# Patient Record
Sex: Female | Born: 1965
Health system: Southern US, Community
[De-identification: ages and names within clinical notes are randomized; demographics above are authoritative.]

## PROBLEM LIST (undated history)

## (undated) ENCOUNTER — Emergency Department (HOSPITAL_COMMUNITY): Admission: EM | Payer: 59 | Source: Home / Self Care

## (undated) HISTORY — PX: REDUCTION MAMMAPLASTY: SUR839

## (undated) HISTORY — PX: NO PAST SURGERIES: SHX2092

---

## 1998-05-11 ENCOUNTER — Inpatient Hospital Stay (HOSPITAL_COMMUNITY): Admission: AD | Admit: 1998-05-11 | Discharge: 1998-05-11 | Payer: Self-pay | Admitting: *Deleted

## 1998-07-08 ENCOUNTER — Emergency Department (HOSPITAL_COMMUNITY): Admission: EM | Admit: 1998-07-08 | Discharge: 1998-07-08 | Payer: Self-pay | Admitting: Emergency Medicine

## 1998-07-08 ENCOUNTER — Encounter: Payer: Self-pay | Admitting: Emergency Medicine

## 1998-08-10 ENCOUNTER — Inpatient Hospital Stay (HOSPITAL_COMMUNITY): Admission: AD | Admit: 1998-08-10 | Discharge: 1998-08-10 | Payer: Self-pay | Admitting: *Deleted

## 1998-10-26 ENCOUNTER — Ambulatory Visit (HOSPITAL_COMMUNITY): Admission: RE | Admit: 1998-10-26 | Discharge: 1998-10-26 | Payer: Self-pay | Admitting: *Deleted

## 2000-11-13 ENCOUNTER — Encounter: Payer: Self-pay | Admitting: *Deleted

## 2000-11-13 ENCOUNTER — Encounter: Admission: RE | Admit: 2000-11-13 | Discharge: 2000-11-13 | Payer: Self-pay | Admitting: *Deleted

## 2000-12-23 ENCOUNTER — Encounter: Admission: RE | Admit: 2000-12-23 | Discharge: 2000-12-23 | Payer: Self-pay | Admitting: Family Medicine

## 2000-12-24 ENCOUNTER — Encounter: Admission: RE | Admit: 2000-12-24 | Discharge: 2000-12-24 | Payer: Self-pay | Admitting: Family Medicine

## 2001-01-27 ENCOUNTER — Encounter: Admission: RE | Admit: 2001-01-27 | Discharge: 2001-01-27 | Payer: Self-pay | Admitting: Sports Medicine

## 2002-01-26 ENCOUNTER — Encounter: Admission: RE | Admit: 2002-01-26 | Discharge: 2002-01-26 | Payer: Self-pay | Admitting: Family Medicine

## 2002-01-28 ENCOUNTER — Encounter (INDEPENDENT_AMBULATORY_CARE_PROVIDER_SITE_OTHER): Payer: Self-pay | Admitting: *Deleted

## 2002-01-28 LAB — CONVERTED CEMR LAB

## 2002-02-19 ENCOUNTER — Encounter: Admission: RE | Admit: 2002-02-19 | Discharge: 2002-02-19 | Payer: Self-pay | Admitting: Family Medicine

## 2002-05-18 ENCOUNTER — Encounter: Admission: RE | Admit: 2002-05-18 | Discharge: 2002-08-16 | Payer: Self-pay | Admitting: Family Medicine

## 2002-12-04 ENCOUNTER — Encounter: Payer: Self-pay | Admitting: Emergency Medicine

## 2002-12-04 ENCOUNTER — Emergency Department (HOSPITAL_COMMUNITY): Admission: EM | Admit: 2002-12-04 | Discharge: 2002-12-04 | Payer: Self-pay | Admitting: Emergency Medicine

## 2003-12-27 ENCOUNTER — Encounter: Admission: RE | Admit: 2003-12-27 | Discharge: 2003-12-27 | Payer: Self-pay | Admitting: Sports Medicine

## 2005-01-03 ENCOUNTER — Ambulatory Visit: Payer: Self-pay | Admitting: Sports Medicine

## 2006-11-28 ENCOUNTER — Encounter (INDEPENDENT_AMBULATORY_CARE_PROVIDER_SITE_OTHER): Payer: Self-pay | Admitting: *Deleted

## 2008-01-15 ENCOUNTER — Encounter (INDEPENDENT_AMBULATORY_CARE_PROVIDER_SITE_OTHER): Payer: Self-pay | Admitting: Family Medicine

## 2008-01-15 ENCOUNTER — Ambulatory Visit: Payer: Self-pay | Admitting: Family Medicine

## 2008-01-18 ENCOUNTER — Ambulatory Visit: Payer: Self-pay | Admitting: Sports Medicine

## 2008-01-18 ENCOUNTER — Encounter (INDEPENDENT_AMBULATORY_CARE_PROVIDER_SITE_OTHER): Payer: Self-pay | Admitting: Family Medicine

## 2008-12-21 ENCOUNTER — Encounter: Payer: Self-pay | Admitting: Family Medicine

## 2008-12-21 ENCOUNTER — Ambulatory Visit: Payer: Self-pay | Admitting: Family Medicine

## 2008-12-21 LAB — CONVERTED CEMR LAB
AST: 17 units/L (ref 0–37)
Albumin: 4.1 g/dL (ref 3.5–5.2)
Calcium: 9.4 mg/dL (ref 8.4–10.5)
Chloride: 105 meq/L (ref 96–112)
Creatinine, Ser: 0.8 mg/dL (ref 0.40–1.20)
Direct LDL: 111 mg/dL — ABNORMAL HIGH
Potassium: 3.9 meq/L (ref 3.5–5.3)
Sodium: 138 meq/L (ref 135–145)

## 2008-12-22 ENCOUNTER — Encounter: Payer: Self-pay | Admitting: Family Medicine

## 2008-12-28 ENCOUNTER — Ambulatory Visit (HOSPITAL_COMMUNITY): Admission: RE | Admit: 2008-12-28 | Discharge: 2008-12-28 | Payer: Self-pay | Admitting: Family Medicine

## 2009-01-05 ENCOUNTER — Telehealth: Payer: Self-pay | Admitting: Family Medicine

## 2009-01-06 ENCOUNTER — Telehealth: Payer: Self-pay | Admitting: Family Medicine

## 2010-01-18 ENCOUNTER — Ambulatory Visit (HOSPITAL_COMMUNITY): Admission: RE | Admit: 2010-01-18 | Discharge: 2010-01-18 | Payer: Self-pay | Admitting: Internal Medicine

## 2010-01-26 ENCOUNTER — Encounter: Admission: RE | Admit: 2010-01-26 | Discharge: 2010-01-26 | Payer: Self-pay | Admitting: Internal Medicine

## 2010-01-26 ENCOUNTER — Encounter: Payer: Self-pay | Admitting: Family Medicine

## 2010-02-01 ENCOUNTER — Telehealth: Payer: Self-pay | Admitting: Family Medicine

## 2010-04-25 ENCOUNTER — Encounter: Payer: Self-pay | Admitting: Family Medicine

## 2010-04-25 ENCOUNTER — Telehealth: Payer: Self-pay | Admitting: Family Medicine

## 2010-04-25 ENCOUNTER — Ambulatory Visit: Payer: Self-pay | Admitting: Family Medicine

## 2010-04-25 LAB — CONVERTED CEMR LAB
ALT: 18 units/L (ref 0–35)
Albumin: 4 g/dL (ref 3.5–5.2)
Alkaline Phosphatase: 64 units/L (ref 39–117)
Glucose, Bld: 88 mg/dL (ref 70–99)
MCHC: 32.3 g/dL (ref 30.0–36.0)
MCV: 90.9 fL (ref 78.0–100.0)
Platelets: 267 10*3/uL (ref 150–400)
RDW: 14 % (ref 11.5–15.5)
Total Bilirubin: 0.3 mg/dL (ref 0.3–1.2)
Total Protein: 7.1 g/dL (ref 6.0–8.3)
Triglycerides: 109 mg/dL (ref <150)
WBC: 5.8 10*3/uL (ref 4.0–10.5)

## 2010-04-26 ENCOUNTER — Encounter: Payer: Self-pay | Admitting: Family Medicine

## 2010-08-16 ENCOUNTER — Encounter: Admission: RE | Admit: 2010-08-16 | Discharge: 2010-08-16 | Payer: Self-pay | Admitting: Internal Medicine

## 2010-10-21 ENCOUNTER — Encounter: Payer: Self-pay | Admitting: Internal Medicine

## 2010-11-01 NOTE — Letter (Signed)
Summary: Left Breast Mass  [facility]  [address]   [place], [place] [address]   Phone: [phone]  Fax: [phone]    01/26/2010  [facility] [address] [place], [place]  [address]  Dear Ms. Hilda Blades,   The recent Mammogram test of your breasts showed that the breast tissue is almost entirely fatty.  A possible mass is noted in the left breast.  Spot compression views and possibly sonography are recommended for further evaluation.  The right breast is unremarkable (normal).  The center has contacted you or will contact you for further studies to evaluate the left breast.  If you have not been contacted by them, please call the imaging center for an appointment.    Please let me know if you have any questions or concerns.          Sincerely,   Dyann Goodspeed MD  Appended Document: Left Breast Mass mailed certified.

## 2010-11-01 NOTE — Letter (Signed)
Summary: Lipid Letter  [facility]  [address]   [place], [place] [address]   Phone: [phone]  Fax: [phone]    04/26/2010  Debra Jones [address] [place], [place]  [address]  Dear Debra Jones:  We have carefully reviewed your last lipid profile from 04/25/2010 and the results are noted below with a summary of recommendations for lipid management.    Cholesterol:       169     Goal: < 200   HDL "good" Cholesterol:   44     Goal: > 39   LDL "bad" Cholesterol:   103     Goal: < 130   Triglycerides:       109     Goal: < 150    TLC Diet (Therapeutic Lifestyle Change): Saturated Fats & Transfatty acids should be kept < 7% of total calories ***Reduce Saturated Fats Polyunstaurated Fat can be up to 10% of total calories Monounsaturated Fat Fat can be up to 20% of total calories Total Fat should be no greater than 25-35% of total calories Carbohydrates should be 50-60% of total calories Protein should be approximately 15% of total calories Fiber should be at least 20-30 grams a day ***Increased fiber may help lower LDL Total Cholesterol should be < 200mg /day Consider adding plant stanol/sterols to diet (example: Benacol spread) ***A higher intake of unsaturated fat may reduce Triglycerides and Increase HDL    Adjunctive Measures (may lower LIPIDS and reduce risk of Heart Attack) include: Aerobic Exercise (20-30 minutes 3-4 times a week) Limit Alcohol Consumption Weight Reduction Aspirin 81 mg a day by mouth (if not allergic or contraindicated) Dietary Fiber 20-30 grams a day by mouth  Your other lab results were:  * Electrolytes: Normal * Kidneys: Normal * Liver: Normal * Hemoglobin: Not Anemic  If you have any questions, please call. We appreciate being able to work with you.   Sincerely,    [facility] Neave Lenger MD  Appended Document: Lipid Letter mailed

## 2010-11-01 NOTE — Progress Notes (Signed)
Summary: Need contact info for patient   Phone Note Outgoing Call   Call placed by: Amariyana Heacox MD,  Feb 01, 2010 10:49 AM Call placed to: Patient Summary of Call: Tried to call patient at home.  Phone number disconnected.  Tried calling at work number, [employer], pt does not work there anymore.  Left message for Supervisor to call me so that I can obtain contact information.  Initial call taken by: Nasiir Monts MD,  Feb 01, 2010 10:49 AM

## 2010-11-01 NOTE — Progress Notes (Signed)
Summary: phn msg: dentist contact info   Phone Note Call from Patient Call back at [phone]   Caller: Patient Summary of Call: Was to give Dr. Janalyn Harder her dentist number and it is Dr. Elder Cyphers [phone]. Initial call taken by: Clydell Hakim,  April 25, 2010 10:47 AM

## 2010-11-01 NOTE — Progress Notes (Signed)
Summary: Breast Center   Called Breast Center on behalf of pt because I have not been able to contact pt.  Spoke with Judeth Cornfield at [facility] [phone].  Bx on 01/30/10 was cancelled by Drs Mayford Knife and Manson Passey as Dr Mayford Knife did not see mass on Ultrasound.  Pt to repeat MMG/U/S in 6 months.

## 2010-11-01 NOTE — Assessment & Plan Note (Signed)
Summary: dental referral/bmc   Vital Signs:  Patient profile:   45 year old female Height:      65 inches Weight:      149 pounds BMI:     24.88 Temp:     98.1 degrees F oral Pulse rate:   86 / minute BP sitting:   125 / 77  Vitals Entered By: Tessie Fass CMA (05/01/2010 8:48 AM)  Primary Care Provider:  Angeline Slim MD  CC:  dental referral.  History of Present Illness: 45 y/o F here for  Dental referral for routine cleaning: Last cleaning 2 yrs ago.  NO cavities. +tingling lower left quadrant, worse when she bites down, sensitivity to hot and cold No other pain No problem with gums Old dentist: Dr Carlean Jews, [place]    Current Medications (verified): 1)  One-A-Day Weight Smart Advance  Tabs (Multiple Vitamins-Minerals) .Marland Kitchen.. 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 Pt followed by [facility].  Repeat MMG in 07/2010.   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 Eyes:  Denies blurring, discharge, and eye irritation. ENT:  Denies decreased hearing, difficulty swallowing,  and hoarseness. CV:  Denies chest pain or discomfort, fainting, lightheadness, near fainting, shortness of breath with exertion, swelling of feet, and swelling of hands. Resp:  Denies chest discomfort, chest pain with inspiration, cough, shortness of breath, sputum productive, and wheezing. GI:  Denies abdominal pain, bloody stools, constipation, dark tarry stools, diarrhea, vomiting, and vomiting blood. GU:  Denies abnormal vaginal bleeding, dysuria, hematuria, and incontinence; Menses: usually middle of month, but not started yet. Irregularfor last few months.  Not sexually active.   Menarch at age 80.  . MS:  Denies joint pain, joint redness, joint swelling, low back pain, and muscle aches.  Physical Exam  General:  Well-developed,well-nourished,in no acute distress; alert,appropriate and cooperative throughout examination. vitals reviewed.  Head:  normocephalic and atraumatic.   Mouth:  Oral mucosa and oropharynx without lesions or exudates.  Teeth in good repair. Neck:  No deformities, masses, or tenderness noted. Lungs:  Normal respiratory effort, chest expands symmetrically. Lungs are clear to auscultation, no crackles or wheezes. Heart:  Normal rate and regular rhythm. S1 and S2 normal without gallop, murmur, click, rub or other extra sounds. Abdomen:  Bowel sounds positive,abdomen soft and non-tender without masses, organomegaly or hernias noted. Msk:  No deformity or scoliosis noted of thoracic or lumbar spine.   Pulses:  R and L radial,dorsalis pedis pulses are full and equal bilaterally Extremities:  No clubbing, cyanosis, edema, or deformity noted with normal full range of motion of all joints.   Neurologic:  alert & oriented X3 and cranial nerves II-XII intact.   Cervical Nodes:  No lymphadenopathy noted   Impression & Recommendations:  Problem # 1:  DENTAL EXAMINATION (ICD-V72.2) Assessment New Pt would like to have routine cleaning.  Last cleaning 2 yrs ago did not show  caries.  She saw Dr Carlean Jews then.  Now she has some intermitten pain around tooth #19, sensitivity to hot and cold on this tooth also.  No obvious defect, infection or gingival inflammation on exam. Will try to refer back to DR Roland.  If he does not take [facility] card, then will refer to dentist in our system.   Orders: Dental Referral (Dentist) [facility]- Est Level  3 ([address]191)  Problem # 2:  HEALTHY ADULT FEMALE (ICD-V70.0) Assessment: Unchanged BP stable.  BMI 24.88, normal.  Will check routine labs CBC, Cmet, FLP (only direct LDL done in 2010) if normal will only need once every 5 yrs.   Orders: CBC-[facility] (47829) Comp Met-[facility] [phone]) Lipid-[facility] ([id number]) [facility]- Est Level  3 (41324)  Complete Medication List: 1)  One-a-day Weight Smart Advance Tabs (Multiple vitamins-minerals) .Marland Kitchen.. 1 tab daily   Prevention & Chronic Care Immunizations   Influenza vaccine: given  (06/30/2008)   Influenza vaccine due: 06/30/2009    Tetanus booster: 10/31/2008: given   Tetanus booster due: 10/31/2018    Pneumococcal vaccine: Not documented  Other Screening   Pap smear: normal  (10/24/2008)   Pap smear due: 10/24/2010    Mammogram: BI-RADS CATEGORY 4:  Suspicious abnormality - biopsy should be^MM DIGITAL DIAG LTD L  (01/26/2010)   Mammogram due: 08/28/2010   Smoking status: never  (12/21/2008)  Lipids   Total Cholesterol: Not documented   LDL: Not documented   LDL Direct: 111  (12/21/2008)   HDL: Not documented   Triglycerides: Not documented

## 2011-03-25 ENCOUNTER — Other Ambulatory Visit: Payer: Self-pay | Admitting: Internal Medicine

## 2011-03-25 DIAGNOSIS — Z1231 Encounter for screening mammogram for malignant neoplasm of breast: Secondary | ICD-10-CM

## 2011-04-01 ENCOUNTER — Ambulatory Visit: Payer: Self-pay

## 2011-11-26 ENCOUNTER — Ambulatory Visit: Payer: Self-pay

## 2011-12-11 ENCOUNTER — Ambulatory Visit
Admission: RE | Admit: 2011-12-11 | Discharge: 2011-12-11 | Disposition: A | Discharge status: Home / Self Care | Payer: 59 | Source: Ambulatory Visit | Attending: Internal Medicine | Admitting: Internal Medicine

## 2011-12-11 DIAGNOSIS — Z1231 Encounter for screening mammogram for malignant neoplasm of breast: Secondary | ICD-10-CM

## 2011-12-18 ENCOUNTER — Encounter: Payer: Self-pay | Admitting: Family Medicine

## 2011-12-18 ENCOUNTER — Other Ambulatory Visit (HOSPITAL_COMMUNITY)
Admission: RE | Admit: 2011-12-18 | Discharge: 2011-12-18 | Disposition: A | Discharge status: Home / Self Care | Payer: 59 | Source: Ambulatory Visit | Attending: Family Medicine | Admitting: Family Medicine

## 2011-12-18 ENCOUNTER — Ambulatory Visit (INDEPENDENT_AMBULATORY_CARE_PROVIDER_SITE_OTHER): Payer: 59 | Admitting: Family Medicine

## 2011-12-18 VITALS — BP 123/86 | HR 87 | Ht 65.0 in | Wt 153.0 lb

## 2011-12-18 DIAGNOSIS — Z0000 Encounter for general adult medical examination without abnormal findings: Secondary | ICD-10-CM

## 2011-12-18 DIAGNOSIS — Z01419 Encounter for gynecological examination (general) (routine) without abnormal findings: Secondary | ICD-10-CM | POA: Insufficient documentation

## 2011-12-18 DIAGNOSIS — Z113 Encounter for screening for infections with a predominantly sexual mode of transmission: Secondary | ICD-10-CM | POA: Insufficient documentation

## 2011-12-18 DIAGNOSIS — E663 Overweight: Secondary | ICD-10-CM

## 2011-12-18 DIAGNOSIS — Z124 Encounter for screening for malignant neoplasm of cervix: Secondary | ICD-10-CM

## 2011-12-18 DIAGNOSIS — Z9189 Other specified personal risk factors, not elsewhere classified: Secondary | ICD-10-CM

## 2011-12-18 DIAGNOSIS — Z202 Contact with and (suspected) exposure to infections with a predominantly sexual mode of transmission: Secondary | ICD-10-CM

## 2011-12-18 NOTE — Patient Instructions (Signed)
STD screening and Lab work: I will mail all results to you.   Diet: Eat 5-9 fruits and vegtables.  Exercise: Try to make an exercise goal to do thing you love- walking- 2-3 x per week.

## 2011-12-18 NOTE — Progress Notes (Signed)
  Subjective:    Patient ID: Debra Jones, female    DOB: Nov 01, 1965, 46 y.o.   MRN: [id number]  HPI Patient is here for routine physical:  Past medical history, surgical history, family history, social history, meds and allergies all updated in the chart.  Health maintenance: Patient agrees to have Pap smear performed today-does not have history of abnormal Pap. Patient states she had a mammogram performed this month-results negative. Has family history of diabetes in mother and maternal grandmother.-Agrees to A1c screen today. Patient does not exercise. Patient eats approximately one to 2 fruits or veggies per day. Patient would like to be screened for STDs-states she hasn't been sexually active in years, but would still like to be screened. No vaginal discharge.  Patient still has menstrual cycles. States that they are irregular.    Review of Systems    as per above. Objective:   Physical Exam  Constitutional: She appears well-developed and well-nourished.  HENT:  Head: Normocephalic and atraumatic.  Right Ear: External ear normal.  Left Ear: External ear normal.  Mouth/Throat: No oropharyngeal exudate.  Eyes: Pupils are equal, round, and reactive to light. Right eye exhibits no discharge. Left eye exhibits no discharge.  Neck: Normal range of motion. Neck supple. No thyromegaly present.  Cardiovascular: Normal rate, regular rhythm and normal heart sounds.  Exam reveals no gallop and no friction rub.   No murmur heard. Pulmonary/Chest: Effort normal and breath sounds normal. No respiratory distress. She has no wheezes. She has no rales. Right breast exhibits no mass, no nipple discharge, no skin change and no tenderness. Left breast exhibits no mass, no nipple discharge, no skin change and no tenderness.  Abdominal: Soft. She exhibits no distension. There is no tenderness. There is no rebound and no guarding.  Genitourinary: Vagina normal and uterus normal. No breast  swelling, tenderness, discharge or bleeding. No labial fusion. There is no rash, tenderness, lesion or injury on the right labia. There is no rash, tenderness, lesion or injury on the left labia. Cervix exhibits no motion tenderness, no discharge and no friability. Right adnexum displays no mass, no tenderness and no fullness. Left adnexum displays no mass, no tenderness and no fullness.  Musculoskeletal: She exhibits no edema.  Lymphadenopathy:    She has no cervical adenopathy.  Neurological: She is alert.  Skin: No rash noted.  Psychiatric: She has a normal mood and affect.          Assessment & Plan:

## 2011-12-19 ENCOUNTER — Encounter: Payer: Self-pay | Admitting: Family Medicine

## 2011-12-19 DIAGNOSIS — Z0000 Encounter for general adult medical examination without abnormal findings: Secondary | ICD-10-CM | POA: Insufficient documentation

## 2011-12-19 NOTE — Assessment & Plan Note (Signed)
Pap smear performed today. Will mail patient results. mammogram performed 11/2011-results negative. Has family history of diabetes in mother and maternal grandmother.- A1c screen today. Encouraged exercise- pt currently inactive. Encouraged patient to increase fruit and veggie intake to 5-9 per day. Not sexually active in years, but would still like to be screened.--screening for GC/Chlam/ HIV/RPR sent to lab

## 2013-01-21 ENCOUNTER — Other Ambulatory Visit: Payer: Self-pay

## 2013-02-01 ENCOUNTER — Other Ambulatory Visit: Payer: Self-pay

## 2013-02-01 DIAGNOSIS — Z1231 Encounter for screening mammogram for malignant neoplasm of breast: Secondary | ICD-10-CM

## 2013-02-17 ENCOUNTER — Encounter: Payer: 59 | Admitting: Family Medicine

## 2013-03-08 ENCOUNTER — Ambulatory Visit
Admission: RE | Admit: 2013-03-08 | Discharge: 2013-03-08 | Disposition: A | Discharge status: Home / Self Care | Payer: Self-pay | Source: Ambulatory Visit

## 2013-03-08 DIAGNOSIS — Z1231 Encounter for screening mammogram for malignant neoplasm of breast: Secondary | ICD-10-CM

## 2013-03-09 ENCOUNTER — Encounter: Payer: Self-pay | Admitting: Family Medicine

## 2013-03-09 ENCOUNTER — Ambulatory Visit (INDEPENDENT_AMBULATORY_CARE_PROVIDER_SITE_OTHER): Payer: 59 | Admitting: Family Medicine

## 2013-03-09 VITALS — BP 125/85 | HR 75 | Temp 98.9°F | Ht 64.0 in | Wt 152.7 lb

## 2013-03-09 DIAGNOSIS — Z0000 Encounter for general adult medical examination without abnormal findings: Secondary | ICD-10-CM

## 2013-03-09 DIAGNOSIS — E663 Overweight: Secondary | ICD-10-CM

## 2013-03-09 DIAGNOSIS — Z6825 Body mass index (BMI) 25.0-25.9, adult: Secondary | ICD-10-CM

## 2013-03-09 LAB — LIPID PANEL
Cholesterol: 171 mg/dL (ref 0–200)
HDL: 44 mg/dL (ref >39)
LDL Cholesterol: 114 mg/dL — ABNORMAL HIGH (ref 0–99)
Triglycerides: 63 mg/dL (ref <150)
VLDL: 13 mg/dL (ref 0–40)

## 2013-03-09 LAB — BASIC METABOLIC PANEL
BUN: 10 mg/dL (ref 6–23)
Sodium: 137 mEq/L (ref 135–145)

## 2013-03-09 LAB — CBC
HCT: 40.2 % (ref 36.0–46.0)
Hemoglobin: 13.8 g/dL (ref 12.0–15.0)
MCH: 29.1 pg (ref 26.0–34.0)
MCHC: 34.3 g/dL (ref 30.0–36.0)
MCV: 84.6 fL (ref 78.0–100.0)
WBC: 6.5 10*3/uL (ref 4.0–10.5)

## 2013-03-09 NOTE — Progress Notes (Signed)
  Subjective:    Patient ID: Debra Jones, female    DOB: 07/29/1966, 47 y.o.   MRN: [id number]  HPI  Erroneous encounter.    Review of Systems     Objective:   Physical Exam        Assessment & Plan:

## 2013-03-09 NOTE — Progress Notes (Signed)
  Subjective:    Patient ID: Debra Jones, female    DOB: [birth year], 47 y.o.   MRN: [id number]  HPI  Presents for annual physical Normal Mammogram on 03/08/13 Normal Pap on 12/18/11 No complaints today Takes no medications Regular exercise No smoking, alcohol or recreational drugs   Review of Systems  All other systems reviewed and are negative.       Objective:   Physical Exam  BP 125/85  Pulse 75  Temp(Src) 98.9 F (37.2 C) (Oral)  Ht 5\' 4"  (1.626 m)  Wt 152 lb 11.2 oz (69.264 kg)  BMI 26.2 kg/m2 Gen: middle aged AA female, well appearing, NAD, pleasant and conversant HEENT: NCAT, PERRLA, EOMI, OP clear and moist, no lymphadenopathy, no thyroid tenderness, enlargement, or nodules CV: RRR, no m/r/g, no JVD or carotid bruits Pulm: normal WOB, CTA-B Abd: soft, NDNT, NABS Extremities: no edema or joint tenderness Skin: warm, dry, no rashes Neuro/Psych: A&Ox4, normal affect, speech, and thought content      Assessment & Plan:  Healthy 47 year old who is up to date on all appropriate health maintenance issues.

## 2013-03-09 NOTE — Patient Instructions (Addendum)
Dear Ms. Glaab,   I am glad that you are doing so well. I will check you blood labs today and send you a note with the results. If all in is normal, then follow up in 1 year.   Congratulations on your son's graduation.   Sincerely,   Dr. Clinton Sawyer

## 2013-03-15 ENCOUNTER — Encounter: Payer: Self-pay | Admitting: Family Medicine

## 2013-03-15 NOTE — Assessment & Plan Note (Signed)
Check labs today and follow up in 1 year.

## 2013-03-19 ENCOUNTER — Encounter: Payer: Self-pay | Admitting: Family Medicine

## 2013-09-14 ENCOUNTER — Other Ambulatory Visit (HOSPITAL_COMMUNITY)
Admission: RE | Admit: 2013-09-14 | Discharge: 2013-09-14 | Disposition: A | Discharge status: Home / Self Care | Payer: 59 | Source: Ambulatory Visit | Attending: Family Medicine | Admitting: Family Medicine

## 2013-09-14 ENCOUNTER — Telehealth: Payer: Self-pay | Admitting: Family Medicine

## 2013-09-14 ENCOUNTER — Encounter: Payer: Self-pay | Admitting: Family Medicine

## 2013-09-14 ENCOUNTER — Ambulatory Visit (INDEPENDENT_AMBULATORY_CARE_PROVIDER_SITE_OTHER): Payer: 59 | Admitting: Family Medicine

## 2013-09-14 VITALS — BP 136/89 | HR 77 | Temp 98.7°F | Ht 64.0 in | Wt 152.0 lb

## 2013-09-14 DIAGNOSIS — N926 Irregular menstruation, unspecified: Secondary | ICD-10-CM

## 2013-09-14 DIAGNOSIS — Z113 Encounter for screening for infections with a predominantly sexual mode of transmission: Secondary | ICD-10-CM | POA: Insufficient documentation

## 2013-09-14 DIAGNOSIS — A599 Trichomoniasis, unspecified: Secondary | ICD-10-CM

## 2013-09-14 DIAGNOSIS — N760 Acute vaginitis: Secondary | ICD-10-CM

## 2013-09-14 LAB — POCT URINE PREGNANCY: Preg Test, Ur: NEGATIVE

## 2013-09-14 LAB — POCT WET PREP (WET MOUNT): WBC, Wet Prep HPF POC: >20

## 2013-09-14 MED ORDER — FLUCONAZOLE 150 MG PO TABS: 150.0000 mg | ORAL_TABLET | Freq: Once | ORAL | Status: DC

## 2013-09-14 MED ORDER — METRONIDAZOLE 500 MG PO TABS: 500.0000 mg | ORAL_TABLET | Freq: Two times a day (BID) | ORAL | Status: DC

## 2013-09-14 NOTE — Patient Instructions (Signed)
Debra Jones,   Thank you for coming to clinic today. Please read below regarding the issues that we discussed.   Discharge - This appears like yeast, but we will check for other infections. I will send a prescription for a tablet call diflucan to the pharmacy which you can use for yeast.   Please follow up in clinic in as needed.  Please call earlier if you have any questions or concerns.   Sincerely,   Dr. Clinton Sawyer

## 2013-09-14 NOTE — Telephone Encounter (Signed)
Attempted to call pt regarding wet prep results. Could not understand person on other line.

## 2013-09-14 NOTE — Progress Notes (Signed)
   Subjective:    Patient ID: Debra Jones, female    DOB: 21-Feb-1966, 47 y.o.   MRN: [id number]  HPI  VAGINAL DISCHARGE  Onset: 1 week  Description: white, creamy Odor: yes  Itching: no  Symptoms Dysuria: no  Bleeding: no  Pelvic pain: no  Back pain: no  Fever: no  Genital sores: no  Rash: no  Dyspareunia: no  GI Sxs: no  Prior treatment: no   Red Flags: Missed period: yes, pt is perimenoupausal   Pregnancy: no  Recent antibiotics: no  Sexual activity: no  Possible STD exposure: yes  IUD: no  Diabetes: no     Review of Systems See hpi    Objective:   Physical Exam BP 136/89  Pulse 77  Temp(Src) 98.7 F (37.1 C) (Oral)  Ht 5\' 4"  (1.626 m)  Wt 152 lb (68.947 kg)  BMI 26.08 kg/m2 Gen: middle aged F, non ill appearing Abd: soft, NDNT, NABS Back: no CVA GU: > External: no lesions > Vagina: copious milky white discharge > Cervix: no lesion; no mucopurulent d/c; no motion tenderness > Uterus: small, mobile > Adnexa: no masses; non tender         Assessment & Plan:  We prep consistent with BV and trich. Treat with flagyl.

## 2013-09-15 ENCOUNTER — Encounter: Payer: Self-pay | Admitting: Family Medicine

## 2013-09-15 DIAGNOSIS — A599 Trichomoniasis, unspecified: Secondary | ICD-10-CM | POA: Insufficient documentation

## 2014-03-03 ENCOUNTER — Other Ambulatory Visit: Payer: Self-pay

## 2014-03-03 DIAGNOSIS — Z1231 Encounter for screening mammogram for malignant neoplasm of breast: Secondary | ICD-10-CM

## 2014-03-15 ENCOUNTER — Ambulatory Visit
Admission: RE | Admit: 2014-03-15 | Discharge: 2014-03-15 | Disposition: A | Discharge status: Home / Self Care | Payer: 59 | Source: Ambulatory Visit

## 2014-03-15 ENCOUNTER — Encounter (INDEPENDENT_AMBULATORY_CARE_PROVIDER_SITE_OTHER): Payer: Self-pay

## 2014-03-15 DIAGNOSIS — Z1231 Encounter for screening mammogram for malignant neoplasm of breast: Secondary | ICD-10-CM

## 2014-05-11 ENCOUNTER — Telehealth: Payer: Self-pay | Admitting: *Deleted

## 2014-05-11 NOTE — Telephone Encounter (Signed)
Spoke with patient and informed her of below 

## 2014-05-11 NOTE — Telephone Encounter (Signed)
Message copied by Farrell OursEVANS, Britnee Mcdevitt K on Wed May 11, 2014 11:10 AM ------      Message from: Clare GandySCHMITZ, JEREMY E      Created: Wed May 11, 2014 10:37 AM       Please call patient and inform that her mammogram is normal and need for follow up in one year. Thank you. ------

## 2014-08-04 ENCOUNTER — Telehealth: Payer: Self-pay | Admitting: Family Medicine

## 2014-08-04 DIAGNOSIS — Z1211 Encounter for screening for malignant neoplasm of colon: Secondary | ICD-10-CM

## 2014-08-04 NOTE — Telephone Encounter (Signed)
Would like a referral for a colonoscopy.  Please advise.

## 2014-08-10 ENCOUNTER — Encounter: Payer: Self-pay | Admitting: Internal Medicine

## 2014-10-07 ENCOUNTER — Ambulatory Visit (AMBULATORY_SURGERY_CENTER): Payer: Self-pay | Admitting: *Deleted

## 2014-10-07 VITALS — Ht 64.0 in | Wt 147.4 lb

## 2014-10-07 DIAGNOSIS — Z1211 Encounter for screening for malignant neoplasm of colon: Secondary | ICD-10-CM

## 2014-10-07 NOTE — Progress Notes (Signed)
No allergies to eggs or soy. No prior anesthesia.  Pt given Emmi instructions for colonoscopy  No oxygen use  No diet drug use  

## 2014-10-11 ENCOUNTER — Telehealth: Payer: Self-pay | Admitting: Internal Medicine

## 2014-10-11 DIAGNOSIS — Z1211 Encounter for screening for malignant neoplasm of colon: Secondary | ICD-10-CM

## 2014-10-11 MED ORDER — MOVIPREP 100 G PO SOLR: ORAL | Status: DC

## 2014-10-11 NOTE — Telephone Encounter (Signed)
Pt says that Dulcolax causes cramping and would like to use another prep.  Rx for MoviPrep sent to [facility] on [address].  Instructions for MoviPrep mailed to pt.  She will call when instructions received to review with a nurse.

## 2014-10-20 ENCOUNTER — Telehealth: Payer: Self-pay | Admitting: Internal Medicine

## 2014-10-20 NOTE — Telephone Encounter (Signed)
Prep changed to Suprep (after speaking with PJ/CG's CMA) and new instructions printed out.  Left for pt on 3rd floor with sample.

## 2014-10-21 ENCOUNTER — Encounter: Payer: 59 | Admitting: Internal Medicine

## 2014-10-28 ENCOUNTER — Ambulatory Visit (AMBULATORY_SURGERY_CENTER): Payer: 59 | Admitting: Internal Medicine

## 2014-10-28 ENCOUNTER — Encounter: Payer: Self-pay | Admitting: Internal Medicine

## 2014-10-28 VITALS — BP 122/91 | HR 64 | Temp 98.6°F | Resp 16 | Ht 64.0 in | Wt 152.0 lb

## 2014-10-28 DIAGNOSIS — Z1211 Encounter for screening for malignant neoplasm of colon: Secondary | ICD-10-CM

## 2014-10-28 MED ORDER — SODIUM CHLORIDE 0.9 % IV SOLN: 500.0000 mL | INTRAVENOUS | Status: DC

## 2014-10-28 NOTE — Op Note (Signed)
[facility] [address] [place], [id number]   COLONOSCOPY PROCEDURE REPORT  PATIENT: Debra Jones, Bhumi  MR#: [id number] BIRTHDATE: 06/09/1966 , 48  yrs. old GENDER: female ENDOSCOPIST: Iva Booparl E Gessner, MD, [employer]FACG PROCEDURE DATE:  10/28/2014 PROCEDURE: First Screening Colonoscopy - Avg.  risk and is 50 yrs.  old or older Yes.  Prior Negative Screening - Now for repeat screening. N/A  History of Adenoma - Now for follow-up colonoscopy & has been > or = to 3 yrs.  N/A  Polyps Removed Today? No.  Polyps Removed Today? No.  Recommend repeat exam, <10 yrs? Polyps Removed Today? No.  Recommend repeat exam, <10 yrs? No. ASA CLASS:   Class I INDICATIONS:first colonoscopy and average risk for colorectal cancer. MEDICATIONS: Propofol 300 mg IV and Monitored anesthesia care  DESCRIPTION OF PROCEDURE:   After the risks benefits and alternatives of the procedure were thoroughly explained, informed consent was obtained.  The digital rectal exam revealed no abnormalities of the rectum.   The LB [id number]  endoscope was introduced through the anus and advanced to the cecum, which was identified by both the appendix and ileocecal valve. No adverse events experienced.   The quality of the prep was excellent, using MoviPrep  The instrument was then slowly withdrawn as the colon was fully examined.      COLON FINDINGS: A normal appearing cecum, ileocecal valve, and appendiceal orifice were identified.  The ascending, transverse, descending, sigmoid colon, and rectum appeared unremarkable. Retroflexed views revealed no abnormalities. The time to cecum=2 minutes 34 seconds.  Withdrawal time=8 minutes 57 seconds.  The scope was withdrawn and the procedure completed. COMPLICATIONS: There were no immediate complications.  ENDOSCOPIC IMPRESSION: Normal colonoscopy - excellent prep - first screening  RECOMMENDATIONS: Repeat colonoscopy 10 years - 2026  eSigned:  Iva Booparl E  Gessner, MD, [facility] 10/28/2014 1:54 PM   cc: Clare GandyJeremy Schmitz, MD and The Patient

## 2014-10-28 NOTE — Progress Notes (Signed)
Procedure ends, to recovery, report given and VSS. 

## 2014-10-28 NOTE — Patient Instructions (Addendum)
Your colonoscopy was normal! No polyps or cancer seen.  Next routine colonoscopy in 10 years - 2026  I appreciate the opportunity to care for you. Iva Booparl E. Gessner, MD, FACG   YOU HAD AN ENDOSCOPIC PROCEDURE TODAY AT THE [facility]: Refer to the procedure report that was given to you for any specific questions about what was found during the examination.  If the procedure report does not answer your questions, please call your gastroenterologist to clarify.  If you requested that your care partner not be given the details of your procedure findings, then the procedure report has been included in a sealed envelope for you to review at your convenience later.  YOU SHOULD EXPECT: Some feelings of bloating in the abdomen. Passage of more gas than usual.  Walking can help get rid of the air that was put into your GI tract during the procedure and reduce the bloating. If you had a lower endoscopy (such as a colonoscopy or flexible sigmoidoscopy) you may notice spotting of blood in your stool or on the toilet paper. If you underwent a bowel prep for your procedure, then you may not have a normal bowel movement for a few days.  DIET: Your first meal following the procedure should be a light meal and then it is ok to progress to your normal diet.  A half-sandwich or bowl of soup is an example of a good first meal.  Heavy or fried foods are harder to digest and may make you feel nauseous or bloated.  Likewise meals heavy in dairy and vegetables can cause extra gas to form and this can also increase the bloating.  Drink plenty of fluids but you should avoid alcoholic beverages for 24 hours.  ACTIVITY: Your care partner should take you home directly after the procedure.  You should plan to take it easy, moving slowly for the rest of the day.  You can resume normal activity the day after the procedure however you should NOT DRIVE or use heavy machinery for 24 hours (because of the sedation  medicines used during the test).    SYMPTOMS TO REPORT IMMEDIATELY: A gastroenterologist can be reached at any hour.  During normal business hours, 8:30 AM to 5:00 PM Monday through Friday, call [phone].  After hours and on weekends, please call the GI answering service at [phone] who will take a message and have the physician on call contact you.   Following lower endoscopy (colonoscopy or flexible sigmoidoscopy):  Excessive amounts of blood in the stool  Significant tenderness or worsening of abdominal pains  Swelling of the abdomen that is new, acute  Fever of 100F or higher  FOLLOW UP: If any biopsies were taken you will be contacted by phone or by letter within the next 1-3 weeks.  Call your gastroenterologist if you have not heard about the biopsies in 3 weeks.  Our staff will call the home number listed on your records the next business day following your procedure to check on you and address any questions or concerns that you may have at that time regarding the information given to you following your procedure. This is a courtesy call and so if there is no answer at the home number and we have not heard from you through the emergency physician on call, we will assume that you have returned to your regular daily activities without incident.  SIGNATURES/CONFIDENTIALITY: You and/or your care partner have signed paperwork which will be entered into  your electronic medical record.  These signatures attest to the fact that that the information above on your After Visit Summary has been reviewed and is understood.  Full responsibility of the confidentiality of this discharge information lies with you and/or your care-partner.

## 2014-10-31 ENCOUNTER — Telehealth: Payer: Self-pay

## 2014-10-31 NOTE — Telephone Encounter (Signed)
No answer, left voicemail

## 2015-07-24 ENCOUNTER — Other Ambulatory Visit: Payer: Self-pay

## 2015-07-24 DIAGNOSIS — Z1231 Encounter for screening mammogram for malignant neoplasm of breast: Secondary | ICD-10-CM

## 2015-08-07 ENCOUNTER — Ambulatory Visit
Admission: RE | Admit: 2015-08-07 | Discharge: 2015-08-07 | Disposition: A | Discharge status: Home / Self Care | Payer: 59 | Source: Ambulatory Visit

## 2015-08-07 DIAGNOSIS — Z1231 Encounter for screening mammogram for malignant neoplasm of breast: Secondary | ICD-10-CM

## 2016-07-25 DIAGNOSIS — H524 Presbyopia: Secondary | ICD-10-CM | POA: Diagnosis not present

## 2016-07-25 DIAGNOSIS — H52203 Unspecified astigmatism, bilateral: Secondary | ICD-10-CM | POA: Diagnosis not present

## 2016-07-25 DIAGNOSIS — H5213 Myopia, bilateral: Secondary | ICD-10-CM | POA: Diagnosis not present

## 2016-09-17 ENCOUNTER — Other Ambulatory Visit: Payer: Self-pay | Admitting: Family Medicine

## 2016-09-17 DIAGNOSIS — Z1231 Encounter for screening mammogram for malignant neoplasm of breast: Secondary | ICD-10-CM

## 2016-10-04 ENCOUNTER — Ambulatory Visit
Admission: RE | Admit: 2016-10-04 | Discharge: 2016-10-04 | Disposition: A | Discharge status: Home / Self Care | Payer: 59 | Source: Ambulatory Visit | Attending: Internal Medicine | Admitting: Internal Medicine

## 2016-10-04 DIAGNOSIS — Z1231 Encounter for screening mammogram for malignant neoplasm of breast: Secondary | ICD-10-CM | POA: Diagnosis not present

## 2017-02-06 ENCOUNTER — Ambulatory Visit (INDEPENDENT_AMBULATORY_CARE_PROVIDER_SITE_OTHER): Payer: 59 | Admitting: Family Medicine

## 2017-02-06 ENCOUNTER — Encounter: Payer: Self-pay | Admitting: Family Medicine

## 2017-02-06 ENCOUNTER — Other Ambulatory Visit (HOSPITAL_COMMUNITY)
Admission: RE | Admit: 2017-02-06 | Discharge: 2017-02-06 | Disposition: A | Discharge status: Home / Self Care | Payer: 59 | Source: Ambulatory Visit | Attending: Family Medicine | Admitting: Family Medicine

## 2017-02-06 VITALS — BP 120/80 | HR 96 | Temp 98.5°F | Ht 65.0 in | Wt 153.0 lb

## 2017-02-06 DIAGNOSIS — Z1159 Encounter for screening for other viral diseases: Secondary | ICD-10-CM

## 2017-02-06 DIAGNOSIS — Z0000 Encounter for general adult medical examination without abnormal findings: Secondary | ICD-10-CM | POA: Diagnosis not present

## 2017-02-06 NOTE — Patient Instructions (Signed)
It was a pleasure to see you today! Thank you for choosing [facility] for your primary care. Freida BusmanHelen Helfand was seen for physical.   Our plans for today were:  Please try to keep a record of her blood pressure, and give me a call or my chart message if you notice them being elevated above 130/80.  I will call you if any lab results are abnormal, otherwise I'll send you a letter.   You should return to our clinic to see Dr. Chanetta Marshallimberlake in one year for another physical.  Best,  Dr. Chanetta Marshallimberlake

## 2017-02-06 NOTE — Progress Notes (Signed)
   CC: physical  HPI Patient has not been here since Dr. Yetta NumbersWilliamson's time, has not needed us. 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 She has 2 children a 53 year old daughter and a 53 year old son. She is not married. She denies any complaints. She does note that she feels like her blood pressure is occasionally elevated when she eats salty foods.  Menstrual history: last period around 18 months ago.   Medical history: none  Surgical history: none  Tobacco use: never Alcohol use: never Drug use: never  CC, SH/smoking status, and VS noted  Objective: BP 120/80   Pulse 96   Temp 98.5 F (36.9 C) (Oral)   Ht 5\' 5"  (1.651 m)   Wt 153 lb (69.4 kg)   LMP 02/07/2015   SpO2 96%   BMI 25.46 kg/m  Gen: NAD, alert, cooperative, and pleasant female.  HEENT: NCAT, EOMI, PERRL CV: RRR, no murmur Resp: CTAB, no wheezes, non-labored GU: normal external female genitalia, cervix well visualized Ext: No edema, warm Neuro: Alert and oriented, Speech clear, No gross deficits  Assessment and plan:  Health maintenance examination Patient feels like her blood pressure has been elevated on occasion after eating salty foods, counseled her to keep a record of her blood pressures and give me a call if she notices them being elevated. Will obtain routine screening labs (CBC, BMP, lipid panel) that she has not had these in several years. Will add HIV, RPR to routine labs, patient would like to be screened for these. She is up-to-date on mammogram and colonoscopy.   Orders Placed This Encounter  Procedures  . CBC  . Basic metabolic panel  . Lipid panel  . Hepatitis C antibody  . HIV antibody  . RPR   I canHealth Maintenance: needs Hep C, will add to today's labs. PAP performed today.   Loni MuseKate Yun Gutierrez, MD, PGY1 02/06/2017 2:06 PM

## 2017-02-06 NOTE — Assessment & Plan Note (Signed)
Patient feels like her blood pressure has been elevated on occasion after eating salty foods, counseled her to keep a record of her blood pressures and give me a call if she notices them being elevated. Will obtain routine screening labs (CBC, BMP, lipid panel) that she has not had these in several years. Will add HIV, RPR to routine labs, patient would like to be screened for these. She is up-to-date on mammogram and colonoscopy.

## 2017-02-07 LAB — RPR: RPR Ser Ql: NONREACTIVE

## 2017-02-07 LAB — CBC
HEMATOCRIT: 42.8 % (ref 34.0–46.6)
HEMOGLOBIN: 14.3 g/dL (ref 11.1–15.9)
MCH: 29.2 pg (ref 26.6–33.0)
MCHC: 33.4 g/dL (ref 31.5–35.7)
MCV: 87 fL (ref 79–97)
Platelets: 270 10*3/uL (ref 150–379)
RBC: 4.9 x10E6/uL (ref 3.77–5.28)
RDW: 14.4 % (ref 12.3–15.4)
WBC: 7.8 10*3/uL (ref 3.4–10.8)

## 2017-02-07 LAB — HEPATITIS C ANTIBODY: Hep C Virus Ab: <0.1 s/co ratio (ref 0.0–0.9)

## 2017-02-07 LAB — BASIC METABOLIC PANEL
BUN/Creatinine Ratio: 14 (ref 9–23)
BUN: 12 mg/dL (ref 6–24)
CO2: 20 mmol/L (ref 18–29)
CREATININE: 0.87 mg/dL (ref 0.57–1.00)
Calcium: 9.7 mg/dL (ref 8.7–10.2)
Chloride: 105 mmol/L (ref 96–106)
GFR calc Af Amer: 88 mL/min/{1.73_m2} (ref >59)
GFR, EST NON AFRICAN AMERICAN: 76 mL/min/{1.73_m2} (ref >59)
Glucose: 126 mg/dL — ABNORMAL HIGH (ref 65–99)
Potassium: 4 mmol/L (ref 3.5–5.2)
SODIUM: 144 mmol/L (ref 134–144)

## 2017-02-07 LAB — LIPID PANEL
CHOL/HDL RATIO: 3.8 ratio (ref 0.0–4.4)
Cholesterol, Total: 196 mg/dL (ref 100–199)
HDL: 51 mg/dL (ref >39)
LDL CALC: 122 mg/dL — AB (ref 0–99)
Triglycerides: 114 mg/dL (ref 0–149)
VLDL Cholesterol Cal: 23 mg/dL (ref 5–40)

## 2017-02-07 LAB — CYTOLOGY - PAP
CHLAMYDIA, DNA PROBE: NEGATIVE
Diagnosis: NEGATIVE
HPV: NOT DETECTED
Neisseria Gonorrhea: NEGATIVE

## 2017-02-07 LAB — HIV ANTIBODY (ROUTINE TESTING W REFLEX): HIV Screen 4th Generation wRfx: NONREACTIVE

## 2017-03-09 ENCOUNTER — Encounter (HOSPITAL_COMMUNITY): Payer: Self-pay | Admitting: Emergency Medicine

## 2017-03-09 ENCOUNTER — Emergency Department (HOSPITAL_COMMUNITY)
Admission: EM | Admit: 2017-03-09 | Discharge: 2017-03-09 | Disposition: A | Discharge status: Home / Self Care | Payer: 59 | Attending: Emergency Medicine | Admitting: Emergency Medicine

## 2017-03-09 DIAGNOSIS — J029 Acute pharyngitis, unspecified: Secondary | ICD-10-CM | POA: Diagnosis not present

## 2017-03-09 DIAGNOSIS — J069 Acute upper respiratory infection, unspecified: Secondary | ICD-10-CM | POA: Insufficient documentation

## 2017-03-09 DIAGNOSIS — R05 Cough: Secondary | ICD-10-CM | POA: Diagnosis present

## 2017-03-09 MED ORDER — BENZONATATE 100 MG PO CAPS: 100.0000 mg | ORAL_CAPSULE | Freq: Three times a day (TID) | ORAL | 0 refills | Status: DC

## 2017-03-09 NOTE — ED Provider Notes (Signed)
[facility]-EMERGENCY DEPT Provider Note   CSN: [id number] Arrival date & time: 03/09/17  1034  By signing my name below, I, Bing NeighborsMaurice Deon Copeland Jr., attest that this documentation has been prepared under the direction and in the presence of Terance HartKelly Lakayla Barrington, PA-C. Electronically signed: Bing NeighborsMaurice Deon Copeland Jr., ED Scribe. 03/09/17. 10:45 AM.   History   Chief Complaint Chief Complaint  Patient presents with  . URI    HPI Debra Jones is a 51 y.o. female with no significant medical hx who presents to the Emergency Department complaining of constant, worsening cough with onset x3 days. Pt states that for the past x3 days she has had worsening URI symptoms which began with a productive cough. She states that now her cough is unproductive and that excessive coughing has produced a sore throat. Pt reports hx of seasonal allergies. She reports unproductive cough, nasal congestion, sore throat, R ear itchiness, postnasal drip. She has taken Claritin for seasonal allergies, Alka Seltzer plus and Sinex nasal spray with no relief. Pt denies fever, SOB. 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 Pt has a PCP.  The history is provided by the patient. No language interpreter was used.    History reviewed. No pertinent past medical history.  Patient Active Problem List   Diagnosis Date Noted  . Health maintenance examination 12/19/2011    Past Surgical History:  Procedure Laterality Date  . NO PAST SURGERIES      OB History    No data available       Home Medications    Prior to Admission medications   Medication Sig Start Date End Date Taking? Authorizing Provider  Multiple Vitamins-Minerals (ONE-A-DAY WEIGHT SMART ADVANCE) TABS Take by mouth daily.      [provider]    Family History Family History  Problem Relation Age of Onset  . Cancer Mother [id number]       breast  . Diabetes Mother   . Hypertension Mother   . Alcohol abuse Father     . Diabetes Maternal Grandmother   . Colon cancer Neg Hx   . Esophageal cancer Neg Hx   . Stomach cancer Neg Hx   . Rectal cancer Neg Hx     Social History Social History  Substance Use Topics  . Smoking status: Never Smoker  . Smokeless tobacco: Never Used  . Alcohol use No     Allergies   Patient has no known allergies.   Review of Systems Review of Systems  Constitutional: Negative for chills and fever.  HENT: Positive for congestion, postnasal drip and sore throat. Negative for ear pain, rhinorrhea and sinus pain.   Respiratory: Positive for cough. Negative for shortness of breath.      Physical Exam Updated Vital Signs BP (!) 145/105   Pulse 95   Temp 98.7 F (37.1 C) (Oral)   Resp 17   LMP 02/07/2015   SpO2 97%   Physical Exam  Constitutional: She is oriented to person, place, and time. She appears well-developed and well-nourished. No distress.  Calm, NAD. Nasal congestion with talking  HENT:  Head: Normocephalic and atraumatic.  Right Ear: Hearing, tympanic membrane, external ear and ear canal normal.  Left Ear: Hearing, tympanic membrane, external ear and ear canal normal.  Nose: Mucosal edema present. No rhinorrhea.  Mouth/Throat: Uvula is midline, oropharynx is clear and moist and mucous membranes are normal.  Eyes: Conjunctivae are normal. Pupils are equal,  round, and reactive to light. Right eye exhibits no discharge. Left eye exhibits no discharge. No scleral icterus.  Neck: Normal range of motion. Neck supple.  Cardiovascular: Normal rate, regular rhythm and intact distal pulses.  Exam reveals no gallop and no friction rub.   No murmur heard. Pulmonary/Chest: Effort normal. No respiratory distress. She has no wheezes. She has no rales. She exhibits no tenderness.  Abdominal: Soft. She exhibits no distension. There is no tenderness.  Musculoskeletal: She exhibits no edema.  Lymphadenopathy:    She has no cervical adenopathy.  No lymphadenopathy.   Neurological: She is alert and oriented to person, place, and time.  Skin: Skin is warm and dry.  Psychiatric: She has a normal mood and affect. Her behavior is normal.  Nursing note and vitals reviewed.    ED Treatments / Results   DIAGNOSTIC STUDIES: Oxygen Saturation is 97% on RA, adequate by my interpretation.   COORDINATION OF CARE: 10:45 AM-Discussed next steps with pt. Pt verbalized understanding and is agreeable with the plan.    Labs (all labs ordered are listed, but only abnormal results are displayed) Labs Reviewed - No data to display  EKG  EKG Interpretation None       Radiology No results found.  Procedures Procedures (including critical care time)  Medications Ordered in ED Medications - No data to display   Initial Impression / Assessment and Plan / ED Course  I have reviewed the triage vital signs and the nursing notes.  Pertinent labs & imaging results that were available during my care of the patient were reviewed by me and considered in my medical decision making (see chart for details).  Pt symptoms consistent with URI. Encouraged rest, fluids, symptomatic treatment. No indication for abx at this time. Discussed return precautions and advised PCP follow up. Work note and Psychiatrist rx given. Pt is hemodynamically stable & in NAD prior to discharge.   Final Clinical Impressions(s) / ED Diagnoses   Final diagnoses:  Viral upper respiratory tract infection    New Prescriptions Discharge Medication List as of 03/09/2017 11:22 AM    START taking these medications   Details  benzonatate (TESSALON) 100 MG capsule Take 1 capsule (100 mg total) by mouth every 8 (eight) hours., Starting Sun 03/09/2017, Print       I personally performed the services described in this documentation, which was scribed in my presence. The recorded information has been reviewed and is accurate.     Bethel Born, PA-C 03/09/17 1233    Vanetta Mulders,  MD 03/10/17 [phone]

## 2017-03-09 NOTE — ED Notes (Signed)
Pt. Stated, I've had a cold and stopped up since Thursday

## 2017-03-09 NOTE — ED Triage Notes (Signed)
Pt sts cough with sore throat and nasal drainage

## 2017-03-09 NOTE — Discharge Instructions (Signed)
Continue Ibuprofen/Tylenol and cough/cold medicine OTC Take Tessalon for cough Continue nasal spray for congestion Rest and drink plenty of fluids

## 2017-03-10 ENCOUNTER — Telehealth: Payer: Self-pay | Admitting: Family Medicine

## 2017-03-10 NOTE — Telephone Encounter (Signed)
Pt was advised. ep °

## 2017-03-10 NOTE — Telephone Encounter (Signed)
Pt called because she was in th ER because she was not feeling well. They told her to call her PCP on Monday so that she can get a prescription of antibiotics. Can we call in something and then let her know so that she can pick this up. jw

## 2017-03-10 NOTE — Telephone Encounter (Signed)
She needs to come in and be seen. Please have her get a same day appointment.

## 2017-03-11 ENCOUNTER — Telehealth: Payer: 59 | Admitting: Nurse Practitioner

## 2017-03-11 DIAGNOSIS — R05 Cough: Secondary | ICD-10-CM | POA: Diagnosis not present

## 2017-03-11 DIAGNOSIS — R059 Cough, unspecified: Secondary | ICD-10-CM

## 2017-03-11 NOTE — Progress Notes (Signed)
We are sorry that you are not feeling well.  Here is how we plan to help!  Based on what you have shared with me it looks like you have upper respiratory tract inflammation that has resulted in a significant cough.  Inflammation and infection in the upper respiratory tract is commonly called bronchitis and has four common causes:  Allergies, Viral Infections, Acid Reflux and Bacterial Infections.  Allergies, viruses and acid reflux are treated by controlling symptoms or eliminating the cause. An example might be a cough caused by taking certain blood pressure medications. You stop the cough by changing the medication. Another example might be a cough caused by acid reflux. Controlling the reflux helps control the cough.  Based on your presentation I believe you most likely have A cough due to a virus.  This is called viral bronchitis and is best treated by rest, plenty of fluids and control of the cough.  You may use Ibuprofen or Tylenol as directed to help your symptoms.     In addition you may use A non-prescription cough medication called Mucinex DM: take 2 tablets every 12 hours.   USE OF BRONCHODILATOR ("RESCUE") INHALERS: There is a risk from using your bronchodilator too frequently.  The risk is that over-reliance on a medication which only relaxes the muscles surrounding the breathing tubes can reduce the effectiveness of medications prescribed to reduce swelling and congestion of the tubes themselves.  Although you feel brief relief from the bronchodilator inhaler, your asthma may actually be worsening with the tubes becoming more swollen and filled with mucus.  This can delay other crucial treatments, such as oral steroid medications. If you need to use a bronchodilator inhaler daily, several times per day, you should discuss this with your provider.  There are probably better treatments that could be used to keep your asthma under control.     HOME CARE . Only take medications as instructed  by your medical team. . Complete the entire course of an antibiotic. . Drink plenty of fluids and get plenty of rest. . Avoid close contacts especially the very young and the elderly . Cover your mouth if you cough or cough into your sleeve. . Always remember to wash your hands . A steam or ultrasonic humidifier can help congestion.   GET HELP RIGHT AWAY IF: . You develop worsening fever. . You become short of breath . You cough up blood. . Your symptoms persist after you have completed your treatment plan MAKE SURE YOU   Understand these instructions.  Will watch your condition.  Will get help right away if you are not doing well or get worse.  Your e-visit answers were reviewed by a board certified advanced clinical practitioner to complete your personal care plan.  Depending on the condition, your plan could have included both over the counter or prescription medications. If there is a problem please reply  once you have received a response from your provider. Your safety is important to us.  If you have drug allergies check your prescription carefully.    You can use MyChart to ask questions about today's visit, request a non-urgent call back, or ask for a work or school excuse for 24 hours related to this e-Visit. If it has been greater than 24 hours you will need to follow up with your provider, or enter a new e-Visit to address those concerns. You will get an e-mail in the next two days asking about your experience.  I hope   that your e-visit has been valuable and will speed your recovery. Thank you for using e-visits.   

## 2017-03-14 ENCOUNTER — Encounter: Payer: Self-pay | Admitting: Student

## 2017-03-14 ENCOUNTER — Ambulatory Visit (INDEPENDENT_AMBULATORY_CARE_PROVIDER_SITE_OTHER): Payer: 59 | Admitting: Student

## 2017-03-14 VITALS — BP 126/80 | HR 78 | Temp 98.1°F | Ht 65.0 in | Wt 155.2 lb

## 2017-03-14 DIAGNOSIS — J302 Other seasonal allergic rhinitis: Secondary | ICD-10-CM

## 2017-03-14 DIAGNOSIS — J069 Acute upper respiratory infection, unspecified: Secondary | ICD-10-CM | POA: Diagnosis not present

## 2017-03-14 DIAGNOSIS — B9789 Other viral agents as the cause of diseases classified elsewhere: Secondary | ICD-10-CM

## 2017-03-14 MED ORDER — FLUTICASONE PROPIONATE 50 MCG/ACT NA SUSP: 2.0000 | Freq: Every day | NASAL | 3 refills | Status: DC

## 2017-03-14 NOTE — Progress Notes (Signed)
  Subjective:    Debra JacobsonHelen is a 51 y.o. old female here for ED follow-up on cough and congestion  HPI Cough and congestion: went to ED about 5 days ago was diagnosed with viral URI and treated with Tessalon for cough. Her symptoms have improved but has not resolved completely. Cough is productive with yellowish sputum. She denies hemoptysis, dyspnea, orthopnea, PND, fever, edema, nausea, vomiting, heartburn and diarrhea. Endorses some chest pain with cough. She endorses postnasal drip, history of seasonal allergies and itchy eyes. She uses Claritin that helps with the allergies. Denies appetite changes. 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 Never smoked cigarettes. She has no history of asthma or COPD. PMH/Problem List: has Health maintenance examination and Viral URI with cough on her problem list.   has no past medical history on file.  FH:  Family History  Problem Relation Age of Onset  . Cancer Mother 7940       breast  . Diabetes Mother   . Hypertension Mother   . Alcohol abuse Father   . Diabetes Maternal Grandmother   . Colon cancer Neg Hx   . Esophageal cancer Neg Hx   . Stomach cancer Neg Hx   . Rectal cancer Neg Hx     SH Social History  Substance Use Topics  . Smoking status: Never Smoker  . Smokeless tobacco: Never Used  . Alcohol use No    Review of Systems Review of systems negative except for pertinent positives and negatives in history of present illness above.     Objective:     Vitals:   03/14/17 0929  BP: 126/80  Pulse: 78  Temp: 98.1 F (36.7 C)  TempSrc: Oral  SpO2: 96%  Weight: 155 lb 3.2 oz (70.4 kg)  Height: 5\' 5"  (1.651 m)    Physical Exam GEN: appears well, no apparent distress. Head: normocephalic and atraumatic  Eyes: conjunctiva without injection, sclera anicteric Nares: Some rhinorrhea and erythema Oropharynx: mmm without erythema or exudation HEM: negative for cervical or periauricular lymphadenopathies CVS: RRR, nl S1&S2,  no murmurs, no edema RESP: no IWOB, good air movement bilaterally, CTAB GI: BS present & normal, soft, NTND SKIN: no apparent skin lesion NEURO: alert and oiented appropriately, no gross defecits  PSYCH: euthymic mood with congruent affect    Assessment and Plan:  Viral URI with cough History and exam suggestive for viral URTI.  There is also some component of allergic rhinitis. Appears well for meningitis or pneumonia. Has no respiratory distress.  -Gave prescription for Flonase nasal  Spray  and discussed about appropriate use -Recommended conservative management.  Discussed with the patient that the cough might take up to 5 weeks to  resolve compleely -Discussed return precautions including but not limited to shortness of breath or increased working of breathing, severe persistent cough, persistent fever over 101F, not tolerating fluids by mouth or other symptoms concerning to her.    Return if symptoms worsen or fail to improve.  Almon Herculesaye T Kohei Antonellis, MD 03/14/17 Pager: [phone]

## 2017-03-14 NOTE — Assessment & Plan Note (Addendum)
History and exam suggestive for viral URTI.  There is also some component of allergic rhinitis. Appears well for meningitis or pneumonia. Has no respiratory distress.  -Gave prescription for Flonase nasal  Spray  and discussed about appropriate use -Recommended conservative management.  Discussed with the patient that the cough might take up to 5 weeks to  resolve compleely -Discussed return precautions including but not limited to shortness of breath or increased working of breathing, severe persistent cough, persistent fever over 101F, not tolerating fluids by mouth or other symptoms concerning to her.

## 2017-03-14 NOTE — Patient Instructions (Signed)
It was great seeing you today! It appears that you have a viral upper respiratory infection (Common Cold) and possible seasonal allergy.  Cold symptoms can last up to 2 weeks.    - I have sent a prescription for Flonase nasal spray to your pharmacy.  - A tablespoonful of honey before bedtime is helpful for cough - Get plenty of rest and adequate hydration. - Consume warm fluids (soup or tea) to provide relief for a stuffy nose and to loosen phlegm. - For nasal stuffiness, try saline nasal spray or a Neti Pot. Eating warm liquids such as chicken soup or tea may also help with nasal congestion. - For sore throat pain relief: suck on throat lozenges, hard candy or popsicles; gargle with warm salt water (1/4 tsp. salt per 8 oz. of water); and eat soft, bland foods. - Eat a well-balanced diet. If you cannot, ensure you are getting enough nutrients by taking a daily multivitamin. - Avoid dairy products, as they can thicken phlegm. - Avoid alcohol, as it impairs your body's immune system.  CONTACT YOUR DOCTOR IF YOU EXPERIENCE ANY OF THE FOLLOWING: - High fever, chest pain, shortness of breath or  not able to keep down food or fluids.  - Cough that gets worse while other cold symptoms improve - Flare up of any chronic lung problem, such as asthma - Your symptoms persist longer than 2 weeks

## 2017-09-02 ENCOUNTER — Telehealth: Payer: 59 | Admitting: Family

## 2017-09-02 ENCOUNTER — Encounter: Payer: Self-pay | Admitting: Family Medicine

## 2017-09-02 DIAGNOSIS — R05 Cough: Secondary | ICD-10-CM | POA: Diagnosis not present

## 2017-09-02 DIAGNOSIS — R059 Cough, unspecified: Secondary | ICD-10-CM

## 2017-09-02 NOTE — Progress Notes (Signed)
We are sorry that you are not feeling well.  Here is how we plan to help!  Based on your presentation I believe you most likely have A cough due to allergies.  I recommend that you start the an over-the counter-allergy medication such as Claritin 10 mg or Zyrtec 10 mg daily.     In addition you may use A non-prescription cough medication called Robitussin DAC. Take 2 teaspoons every 8 hours or Delsym: take 2 teaspoons every 12 hours. Unfortunately, we cannot prescribes stronger cough medications through e-visit.    From your responses in the eVisit questionnaire you describe inflammation in the upper respiratory tract which is causing a significant cough.  This is commonly called Bronchitis and has four common causes:    Allergies  Viral Infections  Acid Reflux  Bacterial Infection Allergies, viruses and acid reflux are treated by controlling symptoms or eliminating the cause. An example might be a cough caused by taking certain blood pressure medications. You stop the cough by changing the medication. Another example might be a cough caused by acid reflux. Controlling the reflux helps control the cough.  USE OF BRONCHODILATOR ("RESCUE") INHALERS: There is a risk from using your bronchodilator too frequently.  The risk is that over-reliance on a medication which only relaxes the muscles surrounding the breathing tubes can reduce the effectiveness of medications prescribed to reduce swelling and congestion of the tubes themselves.  Although you feel brief relief from the bronchodilator inhaler, your asthma may actually be worsening with the tubes becoming more swollen and filled with mucus.  This can delay other crucial treatments, such as oral steroid medications. If you need to use a bronchodilator inhaler daily, several times per day, you should discuss this with your provider.  There are probably better treatments that could be used to keep your asthma under control.     HOME CARE . Only  take medications as instructed by your medical team. . Complete the entire course of an antibiotic. . Drink plenty of fluids and get plenty of rest. . Avoid close contacts especially the very young and the elderly . Cover your mouth if you cough or cough into your sleeve. . Always remember to wash your hands . A steam or ultrasonic humidifier can help congestion.   GET HELP RIGHT AWAY IF: . You develop worsening fever. . You become short of breath . You cough up blood. . Your symptoms persist after you have completed your treatment plan MAKE SURE YOU   Understand these instructions.  Will watch your condition.  Will get help right away if you are not doing well or get worse.  Your e-visit answers were reviewed by a board certified advanced clinical practitioner to complete your personal care plan.  Depending on the condition, your plan could have included both over the counter or prescription medications. If there is a problem please reply  once you have received a response from your provider. Your safety is important to us.  If you have drug allergies check your prescription carefully.    You can use MyChart to ask questions about today's visit, request a non-urgent call back, or ask for a work or school excuse for 24 hours related to this e-Visit. If it has been greater than 24 hours you will need to follow up with your provider, or enter a new e-Visit to address those concerns. You will get an e-mail in the next two days asking about your experience.  I hope that your  e-visit has been valuable and will speed your recovery. Thank you for using e-visits.

## 2017-09-25 DIAGNOSIS — H5213 Myopia, bilateral: Secondary | ICD-10-CM | POA: Diagnosis not present

## 2017-09-25 DIAGNOSIS — H52203 Unspecified astigmatism, bilateral: Secondary | ICD-10-CM | POA: Diagnosis not present

## 2017-09-25 DIAGNOSIS — H524 Presbyopia: Secondary | ICD-10-CM | POA: Diagnosis not present

## 2017-10-22 ENCOUNTER — Other Ambulatory Visit: Payer: Self-pay | Admitting: Internal Medicine

## 2017-10-22 DIAGNOSIS — Z1231 Encounter for screening mammogram for malignant neoplasm of breast: Secondary | ICD-10-CM

## 2017-11-12 DIAGNOSIS — J101 Influenza due to other identified influenza virus with other respiratory manifestations: Secondary | ICD-10-CM | POA: Diagnosis not present

## 2017-11-14 ENCOUNTER — Ambulatory Visit
Admission: RE | Admit: 2017-11-14 | Discharge: 2017-11-14 | Disposition: A | Discharge status: Home / Self Care | Payer: 59 | Source: Ambulatory Visit | Attending: Internal Medicine | Admitting: Internal Medicine

## 2017-11-14 DIAGNOSIS — Z1231 Encounter for screening mammogram for malignant neoplasm of breast: Secondary | ICD-10-CM | POA: Diagnosis not present

## 2018-08-03 ENCOUNTER — Other Ambulatory Visit: Payer: Self-pay

## 2018-08-03 ENCOUNTER — Encounter (HOSPITAL_COMMUNITY): Payer: Self-pay | Admitting: Emergency Medicine

## 2018-08-03 ENCOUNTER — Emergency Department (HOSPITAL_COMMUNITY)
Admission: EM | Admit: 2018-08-03 | Discharge: 2018-08-03 | Disposition: A | Discharge status: Home / Self Care | Payer: PRIVATE HEALTH INSURANCE | Attending: Emergency Medicine | Admitting: Emergency Medicine

## 2018-08-03 DIAGNOSIS — Y999 Unspecified external cause status: Secondary | ICD-10-CM | POA: Insufficient documentation

## 2018-08-03 DIAGNOSIS — Y93F9 Activity, other caregiving: Secondary | ICD-10-CM | POA: Insufficient documentation

## 2018-08-03 DIAGNOSIS — W01198A Fall on same level from slipping, tripping and stumbling with subsequent striking against other object, initial encounter: Secondary | ICD-10-CM | POA: Diagnosis not present

## 2018-08-03 DIAGNOSIS — Y929 Unspecified place or not applicable: Secondary | ICD-10-CM | POA: Insufficient documentation

## 2018-08-03 DIAGNOSIS — Z79899 Other long term (current) drug therapy: Secondary | ICD-10-CM | POA: Diagnosis not present

## 2018-08-03 DIAGNOSIS — S0990XA Unspecified injury of head, initial encounter: Secondary | ICD-10-CM | POA: Insufficient documentation

## 2018-08-03 MED ORDER — IBUPROFEN 400 MG PO TABS
600.0000 mg | ORAL_TABLET | Freq: Once | ORAL | Status: AC
  Administered 2018-08-03: 600 mg via ORAL
  Filled 2018-08-03: qty 1

## 2018-08-03 MED ORDER — ACETAMINOPHEN 325 MG PO TABS
650.0000 mg | ORAL_TABLET | Freq: Once | ORAL | Status: AC
  Administered 2018-08-03: 650 mg via ORAL
  Filled 2018-08-03: qty 2

## 2018-08-03 NOTE — ED Provider Notes (Signed)
[facility] EMERGENCY DEPARTMENT Provider Note   CSN: [id number] Arrival date & time: 08/03/18  1514     History   Chief Complaint Chief Complaint  Patient presents with  . Fall  . Head Injury    HPI Debra Jones is a 52 y.o. female who presents to ED for head injury that occurred prior to arrival.  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  She thought the sheet was stuck under the patient so she forcefully tried to pull it.  However, it was loose then she thought so she fell backwards and hit her head on the floor.  Denies any loss of consciousness.  She was put into a wheelchair and brought to the ED.  She states that the Tylenol given to her as improved her headache.  States that the pain is located on the back of her head at the site of impact.  Denies any numbness in arms or legs, anticoagulant use, neck pain, vision changes, memory changes, vomiting.  HPI  History reviewed. No pertinent past medical history.  Patient Active Problem List   Diagnosis Date Noted  . Viral URI with cough 03/14/2017  . Health maintenance examination 12/19/2011    Past Surgical History:  Procedure Laterality Date  . NO PAST SURGERIES       OB History   None      Home Medications    Prior to Admission medications   Medication Sig Start Date End Date Taking? Authorizing Provider  benzonatate (TESSALON) 100 MG capsule Take 1 capsule (100 mg total) by mouth every 8 (eight) hours. 03/09/17   Bethel Born, PA-C  fluticasone (FLONASE) 50 MCG/ACT nasal spray Place 2 sprays into both nostrils daily. 03/14/17   Almon Hercules, MD  Multiple Vitamins-Minerals (ONE-A-DAY WEIGHT SMART ADVANCE) TABS Take by mouth daily.      [provider]    Family History Family History  Problem Relation Age of Onset  . Cancer Mother 12       breast  . Diabetes Mother   . Hypertension Mother   . Alcohol abuse Father   . Diabetes  Maternal Grandmother   . Colon cancer Neg Hx   . Esophageal cancer Neg Hx   . Stomach cancer Neg Hx   . Rectal cancer Neg Hx     Social History Social History   Tobacco Use  . Smoking status: Never Smoker  . Smokeless tobacco: Never Used  Substance Use Topics  . Alcohol use: No  . Drug use: No     Allergies   Patient has no known allergies.   Review of Systems Review of Systems  Constitutional: Negative for chills and fever.  Gastrointestinal: Negative for nausea and vomiting.  Musculoskeletal: Negative for neck pain and neck stiffness.  Skin: Negative for wound.  Neurological: Positive for headaches. Negative for syncope, weakness and numbness.     Physical Exam Updated Vital Signs BP (!) 133/91 (BP Location: Left Arm)   Pulse 79   Temp 98.9 F (37.2 C) (Oral)   Resp 16   Ht 5\' 5"  (1.651 m)   Wt 68 kg   LMP 02/07/2015   SpO2 98%   BMI 24.96 kg/m   Physical Exam  Constitutional: She is oriented to person, place, and time. She appears well-developed and well-nourished. No distress.  Ambulatory.  HENT:  Head: Normocephalic and atraumatic.  Eyes: Conjunctivae and EOM are normal.  No scleral icterus.  Neck: Normal range of motion.  Pulmonary/Chest: Effort normal. No respiratory distress.  Musculoskeletal: Normal range of motion. She exhibits no tenderness.  No midline spinal tenderness present in lumbar, thoracic or cervical spine. No step-off palpated. No visible bruising, edema or temperature change noted. No objective signs of numbness present. No saddle anesthesia. 2+ DP pulses bilaterally. Sensation intact to light touch. Strength 5/5 in bilateral lower extremities.  Neurological: She is alert and oriented to person, place, and time. No cranial nerve deficit or sensory deficit. She exhibits normal muscle tone. Coordination normal.  Pupils reactive. No facial asymmetry noted. Cranial nerves appear grossly intact. Sensation intact to light touch on face, BUE  and BLE.   Skin: No rash noted. She is not diaphoretic.  No occipital hematoma or wounds noted.  Psychiatric: She has a normal mood and affect.  Nursing note and vitals reviewed.    ED Treatments / Results  Labs (all labs ordered are listed, but only abnormal results are displayed) Labs Reviewed - No data to display  EKG None  Radiology No results found.  Procedures Procedures (including critical care time)  Medications Ordered in ED Medications  ibuprofen (ADVIL,MOTRIN) tablet 600 mg (has no administration in time range)  acetaminophen (TYLENOL) tablet 650 mg (650 mg Oral Given 08/03/18 1545)     Initial Impression / Assessment and Plan / ED Course  I have reviewed the triage vital signs and the nursing notes.  Pertinent labs & imaging results that were available during my care of the patient were reviewed by me and considered in my medical decision making (see chart for details).     52 year old female presents to ED for head injury that occurred just prior to arrival.  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  She denies any loss of consciousness.  She fully remembers the event.  There are no deficits on neurological exam noted.  No wounds noted.  She is ambulatory here.  She is not anticoagulated.  Discussed with patient regarding unremarkable neurological exam and low likelihood of intracranial abnormality based on her risk factors and physical exam findings.  I do not see any indication to CT her head at this time.  Patient is agreeable to this plan.  We will treat with Tylenol and concussion precautions. There are no headache characteristics that are lateralizing or concerning for increased ICP, infectious or vascular cause of her symptoms.   Patient was counseled on head injury precautions and symptoms that should indicate their return to the ED.  These include severe worsening headache, vision  changes, confusion, loss of consciousness, trouble walking, nausea & vomiting, or weakness/tingling in extremities.  Evaluation does not show pathology that would require ongoing emergent intervention or inpatient treatment. I explained the diagnosis to the patient. Pain has been managed and has no complaints prior to discharge. Patient is comfortable with above plan and is stable for discharge at this time. All questions were answered prior to disposition. Strict return precautions for returning to the ED were discussed. Encouraged follow up with PCP.    Portions of this note were generated with Scientist, clinical (histocompatibility and immunogenetics). Dictation errors may occur despite best attempts at proofreading.  Final Clinical Impressions(s) / ED Diagnoses   Final diagnoses:  Injury of head, initial encounter    ED Discharge Orders    None       Dorinne Graeff, PA-C  08/03/18 1632    Wynetta Fines, MD 08/07/18 1447

## 2018-08-03 NOTE — ED Triage Notes (Signed)
Pt to ED via wheelchair from up stairs.  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  Pt denies any LOC.  Pt alert and oriented at this time

## 2018-08-03 NOTE — Discharge Instructions (Signed)
Take Tylenol as needed for pain. Return to ED for worsening symptoms, trouble walking, additional injuries or falls, blurry vision, severe headache that is not improved with medications.

## 2018-08-06 ENCOUNTER — Ambulatory Visit (INDEPENDENT_AMBULATORY_CARE_PROVIDER_SITE_OTHER): Payer: 59 | Admitting: Family Medicine

## 2018-08-06 DIAGNOSIS — R109 Unspecified abdominal pain: Secondary | ICD-10-CM

## 2018-08-06 MED ORDER — IBUPROFEN 600 MG PO TABS: 600.0000 mg | ORAL_TABLET | Freq: Four times a day (QID) | ORAL | 0 refills | Status: DC | PRN

## 2018-08-06 NOTE — Progress Notes (Addendum)
   Subjective:    Patient ID: Debra Jones, female    DOB: 23-Jul-1966, 52 y.o.   MRN: [id number]  Larey Seat at work 3 days ago.  It occurred while she was moving a patient.  Patient slid and landed on her right side.  She has had continued pain in her side, head, and buttock since then.  Patient was seen in the ED the fall, work-up was negative.   The pain is not getting worse.  It is actually getting a little bit better.  Patient says that it pain is controlled with OTC medications.  Patient has not had any ongoing disability from work with this.  Patient is not having any falls other than this 1.  Describes the pain as a soreness.  Does not radiate anywhere.   Review of Systems  Respiratory: Negative for chest tightness and shortness of breath.   Neurological: Negative for dizziness.       Negative for loss of consciousness  All other systems reviewed and are negative.      Objective:   Physical Exam  Constitutional: She is oriented to person, place, and time. She appears well-developed and well-nourished. No distress.  HENT:  Head: Normocephalic and atraumatic.  Neck: Normal range of motion. Neck supple. No JVD present.  Cardiovascular: Normal rate and regular rhythm.  Pulmonary/Chest: Effort normal and breath sounds normal.  Abdominal: Soft. She exhibits distension.  Musculoskeletal: Normal range of motion. She exhibits edema.  Neurological: She is alert and oriented to person, place, and time.  Skin: Skin is warm. Capillary refill takes less than 2 seconds.  Psychiatric: She has a normal mood and affect. Her behavior is normal.   There were no vitals filed for this visit.      Assessment & Plan:  Patient presents status post fall.  Given negative work-up ED and reassuring exam today, no concern for prolonged sequelae from event.  Given its isolation, appears to be mechanical fall without any contributing factors regarding the patient's health.  Recommend continued supportive care  with ibuprofen as needed pain, follow-up as needed.

## 2018-08-06 NOTE — Progress Notes (Deleted)
Fell at work 3 days ago . Seen at ed, neg work up. Hit head ojn right sied andhit right buttock, fell when moving patient, no dizziness, loc, chest pain, short ness of breath,

## 2018-08-06 NOTE — Patient Instructions (Signed)
It was a pleasure to see you today! Thank you for choosing [facility] for your primary care. Debra Jones was seen for follow up after a fall.   You have no signs or symptoms of neurologic issue. You can take the prescription below for pain.  Meds ordered this encounter  Medications  . ibuprofen (ADVIL,MOTRIN) 600 MG tablet    Sig: Take 1 tablet (600 mg total) by mouth every 6 (six) hours as needed.    Dispense:  30 tablet    Refill:  0   Best,  Thomes Dinning, MD, MS FAMILY MEDICINE RESIDENT - PGY2 08/06/2018 11:17 AM

## 2018-08-24 ENCOUNTER — Ambulatory Visit: Payer: 59 | Admitting: Family Medicine

## 2018-10-08 DIAGNOSIS — H52203 Unspecified astigmatism, bilateral: Secondary | ICD-10-CM | POA: Diagnosis not present

## 2018-10-08 DIAGNOSIS — H524 Presbyopia: Secondary | ICD-10-CM | POA: Diagnosis not present

## 2018-10-08 DIAGNOSIS — H5213 Myopia, bilateral: Secondary | ICD-10-CM | POA: Diagnosis not present

## 2018-10-16 ENCOUNTER — Ambulatory Visit (INDEPENDENT_AMBULATORY_CARE_PROVIDER_SITE_OTHER): Payer: 59 | Admitting: Family Medicine

## 2018-10-16 ENCOUNTER — Encounter: Payer: Self-pay | Admitting: Family Medicine

## 2018-10-16 ENCOUNTER — Other Ambulatory Visit: Payer: Self-pay

## 2018-10-16 VITALS — BP 132/86 | HR 79 | Temp 98.4°F | Ht 65.0 in | Wt 156.8 lb

## 2018-10-16 DIAGNOSIS — J302 Other seasonal allergic rhinitis: Secondary | ICD-10-CM

## 2018-10-16 DIAGNOSIS — F5102 Adjustment insomnia: Secondary | ICD-10-CM

## 2018-10-16 MED ORDER — FLUTICASONE PROPIONATE 50 MCG/ACT NA SUSP: 2.0000 | Freq: Every day | NASAL | 3 refills | Status: DC

## 2018-10-16 NOTE — Progress Notes (Signed)
   CC: sleep, relationship concerns  HPI  Got married in May, her husband has COPD and OSA, he is snoring a lot. They are working on this with his doctor as well. She is having trouble sleeping with the snoring. She is getting woken up do the snoring. She feels like the lack of sleep is making her feel sort of "depressed." She goes on to discuss concerns with communication and adjustment to marriage after being single for many years. She is safe in the relationship, but struggling with the adjustment and lack of sleep.    ROS: Denies CP, SOB, abdominal pain, dysuria, changes in BMs.   CC, SH/smoking status, and VS noted  Objective: BP 132/86   Pulse 79   Temp 98.4 F (36.9 C) (Oral)   Ht 5\' 5"  (1.651 m)   Wt 156 lb 12.8 oz (71.1 kg)   LMP 02/07/2015   SpO2 99%   BMI 26.09 kg/m  Gen: NAD, alert, cooperative, and pleasant. Tearful.  HEENT: NCAT, EOMI, PERRL CV: RRR, no murmur Resp: CTAB, no wheezes, non-labored Ext: No edema, warm Neuro: Alert and oriented, Speech clear, No gross deficits  Assessment and plan:  Sleep concerns -counseled the patient that if she has been woken up due to her husband's snoring, there is likely not a medicine to fix this.  She states that she does not desire medicine anyway.  We discussed some behavioral modifications including consideration of sleeping in separate rooms, which I reassured her is normal for many couples, as well as trying a white noise machine.  This would also be something good to discuss with her counselor, see below.  Relationship stress: Patient notes some stress over recent marriage, including husband's medical conditions as well as generally adjusting to being married after being single for so long.  She is tearful discussing this, but states she is safe.  I provided a listening ear, and also strongly recommended that she see a counselor both individually and as a couple with the [facility] employee assistance counseling program, which is  free to employees.  She was given the phone number information for this.  We will follow-up in 1 month to see how this is going.   Loni Muse, MD, PGY3 10/16/2018 9:32 AM

## 2018-10-16 NOTE — Patient Instructions (Signed)
It was a pleasure to see you today! Thank you for choosing [facility] for your primary care. Debra Jones was seen for sleep.   Our plans for today were:  Call the Counseling Program and consider setting up an appt for you individually as well as you all together.   For sleep, consider sleeping in separate rooms. This is totally normal and doesn't mean anything about your marriage.   You could also try a white noise machine (there are free apps for this as well as ~$20 machines on [place]).   You may want to ask him to switch sides of the bed in case that would help him face the other direction and make the snoring less loud.   Best,  Dr. Chanetta Marshall

## 2018-10-28 ENCOUNTER — Other Ambulatory Visit: Payer: Self-pay

## 2018-10-28 ENCOUNTER — Ambulatory Visit (INDEPENDENT_AMBULATORY_CARE_PROVIDER_SITE_OTHER): Payer: 59 | Admitting: Family Medicine

## 2018-10-28 VITALS — BP 118/78 | HR 59 | Temp 97.9°F | Ht 65.0 in | Wt 158.6 lb

## 2018-10-28 DIAGNOSIS — S39012A Strain of muscle, fascia and tendon of lower back, initial encounter: Secondary | ICD-10-CM

## 2018-10-28 MED ORDER — KETOROLAC TROMETHAMINE 30 MG/ML IJ SOLN
30.0000 mg | Freq: Once | INTRAMUSCULAR | Status: AC
  Administered 2018-10-28: 30 mg via INTRAMUSCULAR

## 2018-10-28 MED ORDER — CYCLOBENZAPRINE HCL 5 MG PO TABS: 5.0000 mg | ORAL_TABLET | Freq: Three times a day (TID) | ORAL | 0 refills | Status: DC | PRN

## 2018-10-28 NOTE — Patient Instructions (Signed)
It was a pleasure to see you today! Thank you for choosing [facility] for your primary care. Debra Jones was seen for back muscle strain.   Our plans for today were:  You have a back muscle strain, and it will take some time to recover on it's own. If what we are trying doesn't work, or things get worse, call me back.   Please consider introducing some core strengthening into your exercise routine, such as pilates.   Use the muscle relaxers only when you aren't driving.   Best,  Dr. Chanetta Marshall

## 2018-10-28 NOTE — Progress Notes (Signed)
   CC: back pain   HPI  Back pain - started Saturday, located on the R flank. Was acting up Monday. Has been lingering. Tried ibuprofen, helped some. Woke up with persistent pain. Feels like a knot in her back. Tried some stretching. Worse with certain movements, some worse with walking. Never had something like this before. No dysuria. Urine looks normal. Tried some heat, tried ice packs. Feels like it may be going toward her groin. No fevers or otherwise myalgias or feeling bad.   ROS: Denies CP, SOB, abdominal pain, dysuria, changes in BMs.   CC, SH/smoking status, and VS noted  Objective: BP 118/78   Pulse (!) 59   Temp 97.9 F (36.6 C) (Oral)   Ht 5\' 5"  (1.651 m)   Wt 158 lb 9.6 oz (71.9 kg)   LMP 02/07/2015   SpO2 99%   BMI 26.39 kg/m  Gen: NAD, alert, cooperative, and pleasant. HEENT: NCAT, EOMI, PERRL CV: RRR, no murmur Resp: CTAB, no wheezes, non-labored Abd: SNTND, BS present, no guarding or organomegaly MSK: TTP over lumbar paraspinal muscles on L, no CVAT, negative straight leg raise, normal gait Ext: No edema, warm Neuro: Alert and oriented, Speech clear, No gross deficits  Assessment and plan:  1. Strain of lumbar paraspinal muscle, initial encounter Patient would like something today in the office so she can head back over to work at [employer].  We will try a Toradol injection and Flexeril as needed.  She can also continue to use Tylenol, heat, ice.  Encouraged continued core strengthening to avoid future injury, expect this to resolve over the next several weeks.  Encouraged her to call us if she has changes in symptoms or worsening pain.  Considered UTI although no dysuria or fever.  Considered kidney stone although pain seems to be associated with certain movements. - cyclobenzaprine (FLEXERIL) 5 MG tablet; Take 1 tablet (5 mg total) by mouth 3 (three) times daily as needed for muscle spasms.  Dispense: 10 tablet; Refill: 0 - ketorolac (TORADOL) 30 MG/ML injection 30  mg   Loni Muse, MD, PGY3 10/28/2018 10:57 AM

## 2018-11-05 DIAGNOSIS — M5442 Lumbago with sciatica, left side: Secondary | ICD-10-CM | POA: Diagnosis not present

## 2018-11-05 DIAGNOSIS — M25552 Pain in left hip: Secondary | ICD-10-CM | POA: Diagnosis not present

## 2018-11-06 ENCOUNTER — Other Ambulatory Visit (HOSPITAL_COMMUNITY): Payer: Self-pay | Admitting: Neurosurgery

## 2018-11-06 ENCOUNTER — Other Ambulatory Visit: Payer: Self-pay | Admitting: Neurosurgery

## 2018-11-06 DIAGNOSIS — M5442 Lumbago with sciatica, left side: Secondary | ICD-10-CM

## 2018-11-14 ENCOUNTER — Ambulatory Visit (HOSPITAL_COMMUNITY)
Admission: RE | Admit: 2018-11-14 | Discharge: 2018-11-14 | Disposition: A | Discharge status: Home / Self Care | Payer: 59 | Source: Ambulatory Visit | Attending: Neurosurgery | Admitting: Neurosurgery

## 2018-11-14 DIAGNOSIS — M5442 Lumbago with sciatica, left side: Secondary | ICD-10-CM | POA: Diagnosis not present

## 2018-11-14 DIAGNOSIS — M545 Low back pain: Secondary | ICD-10-CM | POA: Diagnosis not present

## 2018-11-25 ENCOUNTER — Other Ambulatory Visit: Payer: Self-pay | Admitting: Family Medicine

## 2018-11-25 ENCOUNTER — Other Ambulatory Visit: Payer: Self-pay | Admitting: Internal Medicine

## 2018-11-25 DIAGNOSIS — Z1231 Encounter for screening mammogram for malignant neoplasm of breast: Secondary | ICD-10-CM

## 2018-11-26 ENCOUNTER — Ambulatory Visit
Admission: RE | Admit: 2018-11-26 | Discharge: 2018-11-26 | Disposition: A | Discharge status: Home / Self Care | Payer: 59 | Source: Ambulatory Visit | Attending: Internal Medicine | Admitting: Internal Medicine

## 2018-11-26 DIAGNOSIS — Z1231 Encounter for screening mammogram for malignant neoplasm of breast: Secondary | ICD-10-CM | POA: Diagnosis not present

## 2018-11-30 ENCOUNTER — Ambulatory Visit: Payer: 59

## 2018-12-03 ENCOUNTER — Ambulatory Visit: Payer: 59

## 2018-12-10 ENCOUNTER — Ambulatory Visit (INDEPENDENT_AMBULATORY_CARE_PROVIDER_SITE_OTHER): Payer: 59

## 2018-12-10 ENCOUNTER — Other Ambulatory Visit: Payer: Self-pay

## 2018-12-10 DIAGNOSIS — Z23 Encounter for immunization: Secondary | ICD-10-CM | POA: Diagnosis not present

## 2018-12-10 NOTE — Progress Notes (Signed)
   Patient in to nurse clinic to update tetanus vaccine. TDaP given right deltoid. Tolerated well.  Ples Specter, RN [facility] RN)

## 2019-01-22 MED FILL — FLUTICASONE PROP 50 MCG SPR: 50 | 30 days supply | Qty: 16 | Fill #0

## 2019-03-09 IMAGING — MG DIGITAL SCREENING BILATERAL MAMMOGRAM WITH TOMO AND CAD
8 series · 8 of 24 positions shown · non-contrast
Comparison: Previous exam(s).

ACR Breast Density Category a: The breast tissue is almost entirely
fatty.

CLINICAL DATA: Screening.

EXAM:
DIGITAL SCREENING BILATERAL MAMMOGRAM WITH TOMO AND CAD

[R MLO synth-2D]
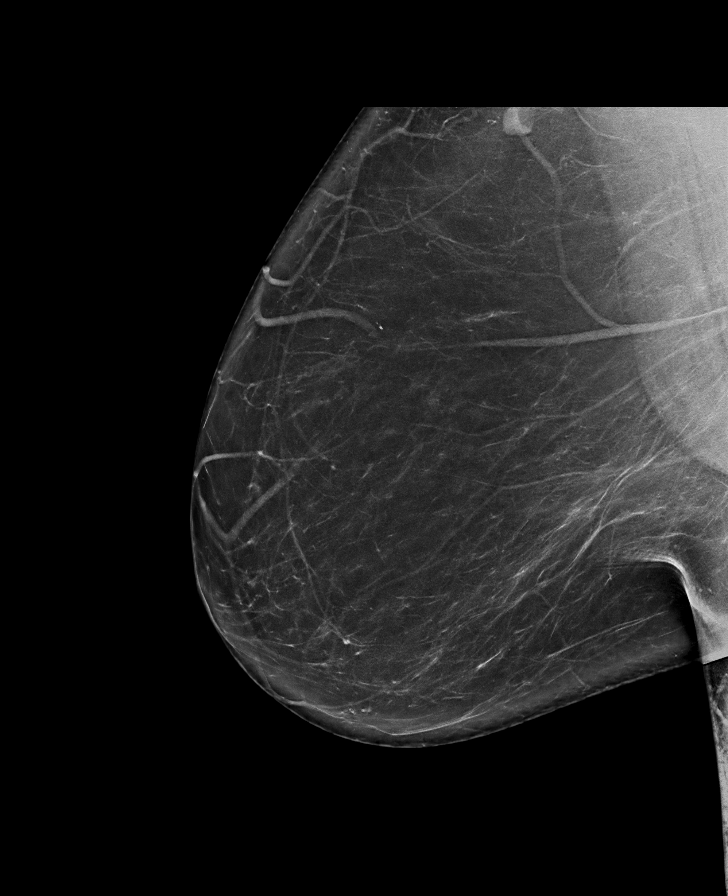

[L CC synth-2D]
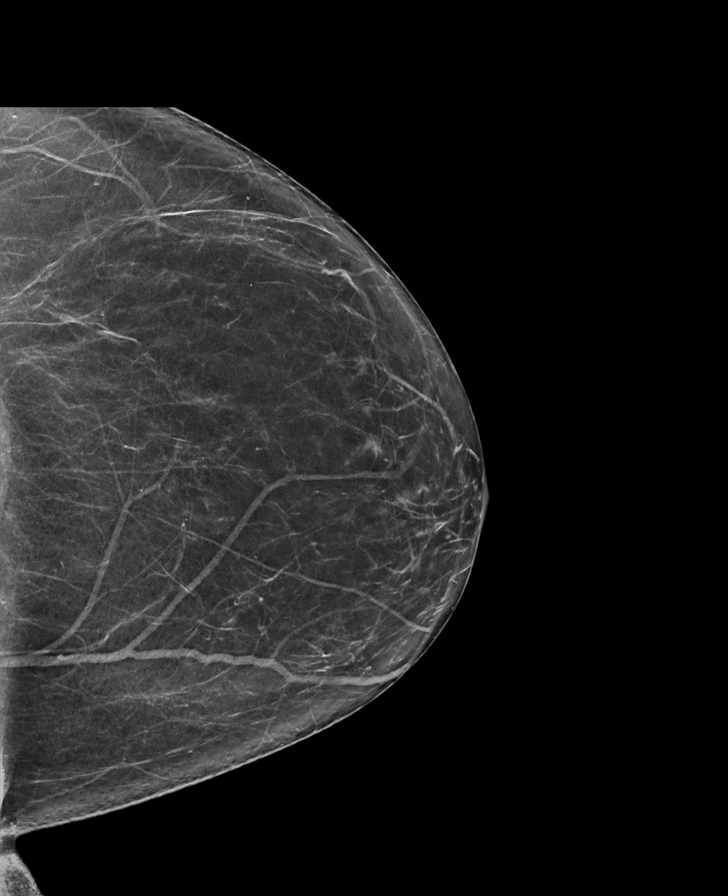

[R CC synth-2D]
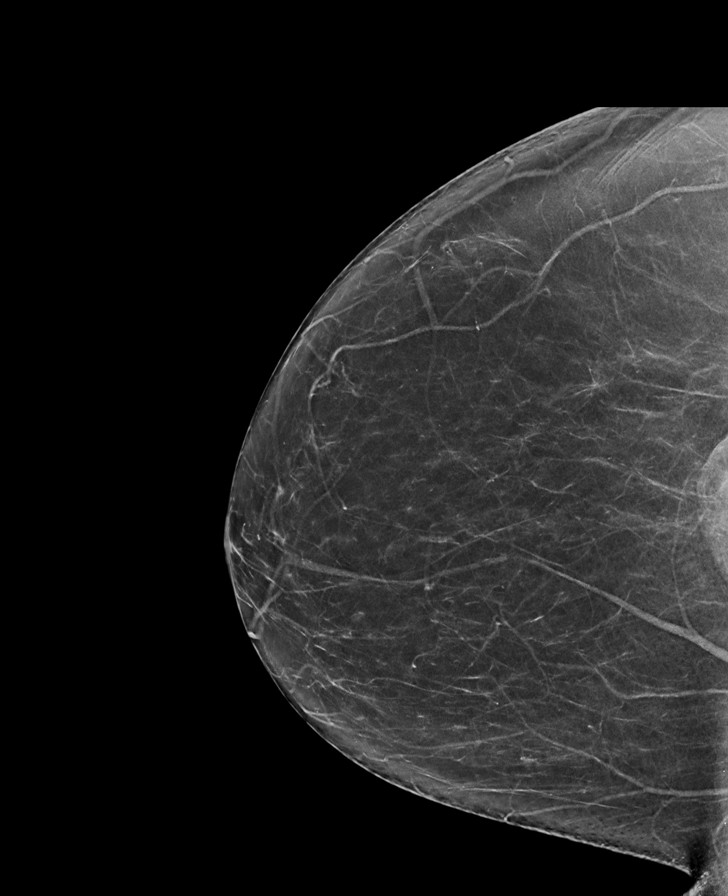

[L MLO synth-2D]
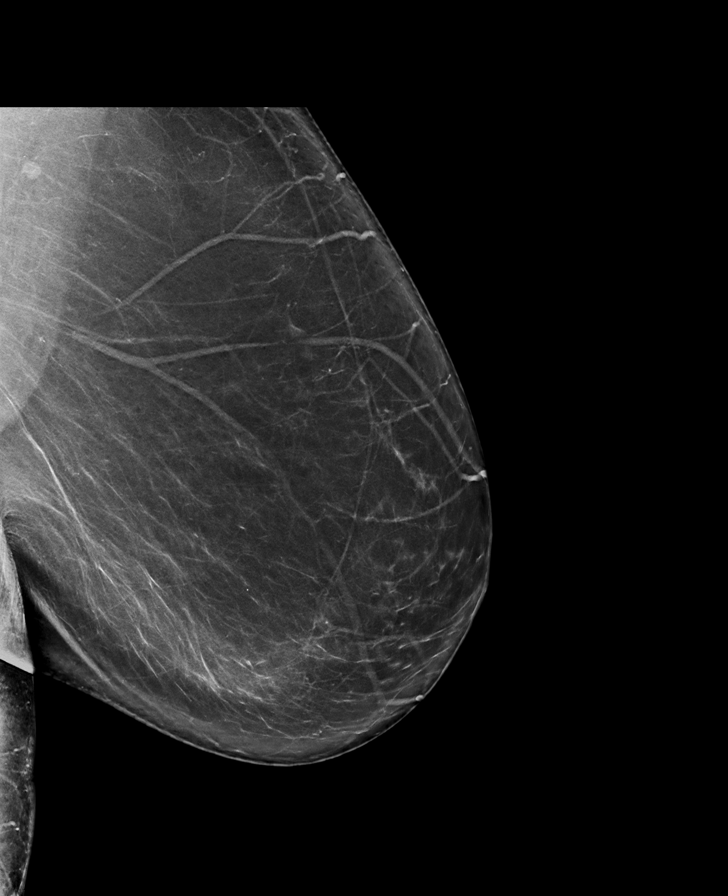

[R MLO tomo · tomo slice 45/90.0]
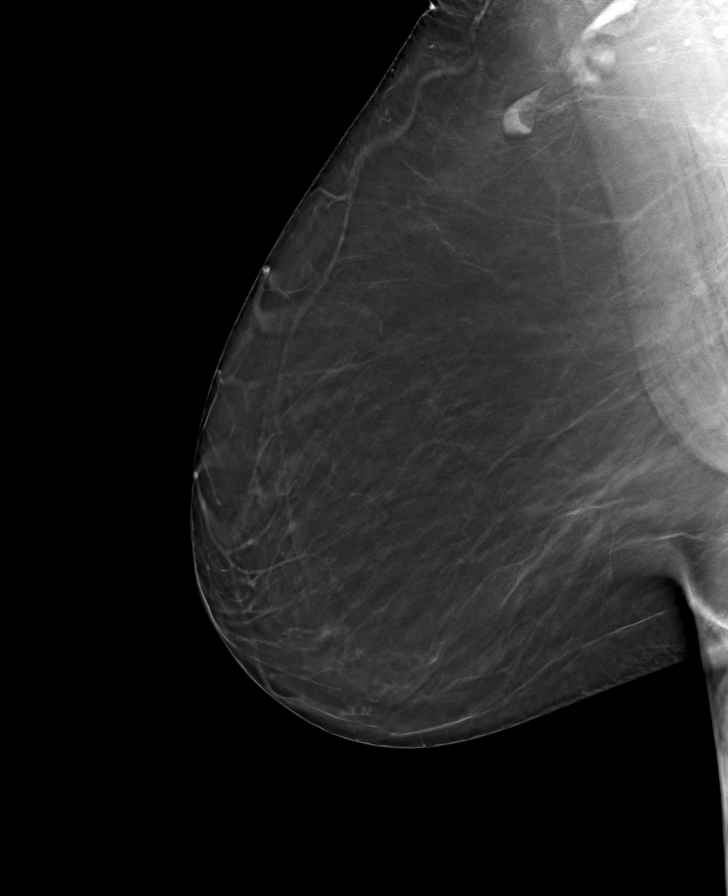

[L MLO tomo · tomo slice 45/89.0]
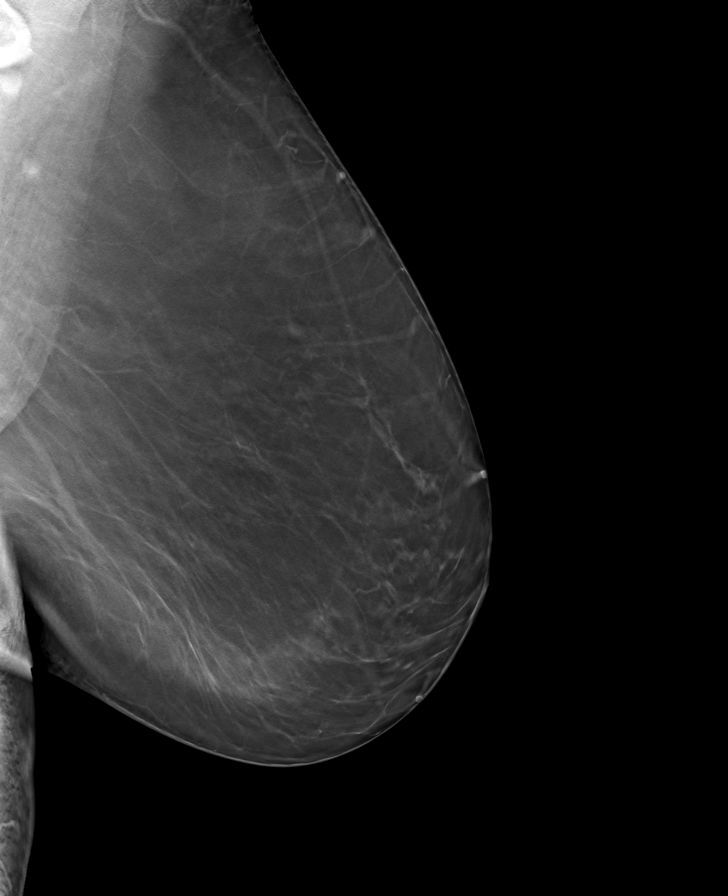

[L CC tomo · tomo slice 38/75.0]
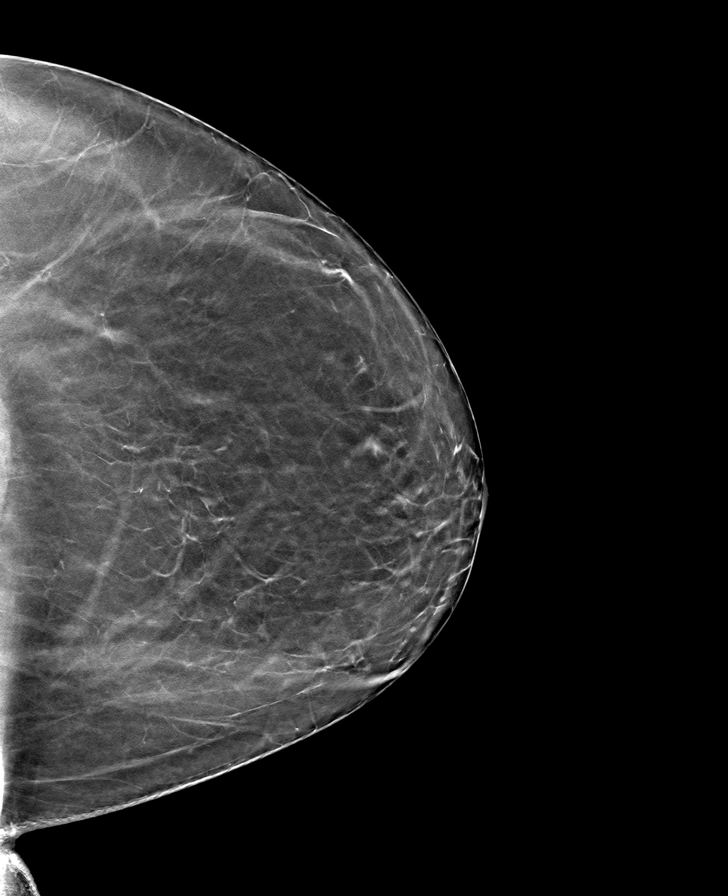

[R CC tomo · tomo slice 37/74.0]
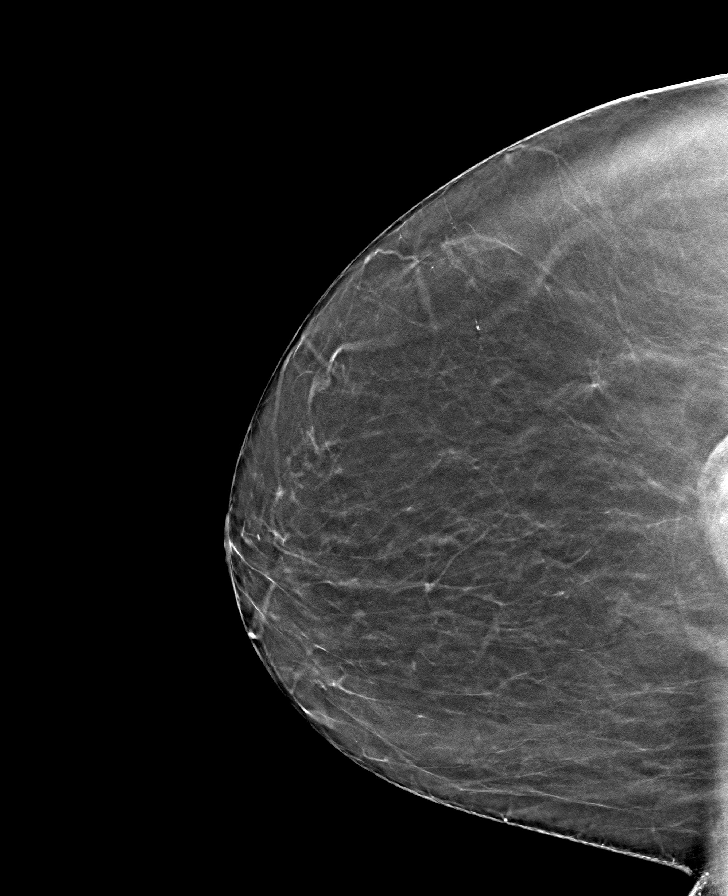

[8 of 24 positions shown; findings below may reference images not displayed]

FINDINGS: There are no findings suspicious for malignancy. Images were
processed with CAD.
IMPRESSION: No mammographic evidence of malignancy. A result letter of this
screening mammogram will be mailed directly to the patient.

RECOMMENDATION:
Screening mammogram in one year. (Code:[id number]-[id number])

BI-RADS CATEGORY  1: Negative.

## 2019-10-14 DIAGNOSIS — H5213 Myopia, bilateral: Secondary | ICD-10-CM | POA: Diagnosis not present

## 2019-10-14 DIAGNOSIS — H524 Presbyopia: Secondary | ICD-10-CM | POA: Diagnosis not present

## 2019-10-14 DIAGNOSIS — H25813 Combined forms of age-related cataract, bilateral: Secondary | ICD-10-CM | POA: Diagnosis not present

## 2019-10-14 DIAGNOSIS — H52203 Unspecified astigmatism, bilateral: Secondary | ICD-10-CM | POA: Diagnosis not present

## 2019-11-03 ENCOUNTER — Other Ambulatory Visit: Payer: Self-pay | Admitting: Internal Medicine

## 2019-11-03 DIAGNOSIS — Z1231 Encounter for screening mammogram for malignant neoplasm of breast: Secondary | ICD-10-CM

## 2019-12-10 ENCOUNTER — Ambulatory Visit
Admission: RE | Admit: 2019-12-10 | Discharge: 2019-12-10 | Disposition: A | Discharge status: Home / Self Care | Payer: 59 | Source: Ambulatory Visit | Attending: Internal Medicine | Admitting: Internal Medicine

## 2019-12-10 ENCOUNTER — Other Ambulatory Visit: Payer: Self-pay

## 2019-12-10 DIAGNOSIS — Z1231 Encounter for screening mammogram for malignant neoplasm of breast: Secondary | ICD-10-CM

## 2020-02-01 ENCOUNTER — Telehealth: Payer: 59 | Admitting: Physician Assistant

## 2020-02-01 DIAGNOSIS — J301 Allergic rhinitis due to pollen: Secondary | ICD-10-CM | POA: Diagnosis not present

## 2020-02-01 MED ORDER — OLOPATADINE HCL 0.2 % OP SOLN: 1.0000 [drp] | Freq: Every day | OPHTHALMIC | 0 refills | Status: DC

## 2020-02-01 MED ORDER — CETIRIZINE HCL 10 MG PO TABS: 10.0000 mg | ORAL_TABLET | Freq: Every day | ORAL | 1 refills | Status: DC

## 2020-02-01 MED ORDER — FLUTICASONE PROPIONATE 50 MCG/ACT NA SUSP: 2.0000 | Freq: Every day | NASAL | 0 refills | Status: DC

## 2020-02-01 NOTE — Progress Notes (Signed)
E visit for Allergic Rhinitis We are sorry that you are not feeling well.  Here is how we plan to help!  Based on what you have shared with me it looks like you have Allergic Rhinitis.  Rhinitis is when a reaction occurs that causes nasal congestion, runny nose, sneezing, and itching.  Most types of rhinitis are caused by an inflammation and are associated with symptoms in the eyes ears or throat. There are several types of rhinitis.  The most common are acute rhinitis, which is usually caused by a viral illness, allergic or seasonal rhinitis, and nonallergic or year-round rhinitis.  Nasal allergies occur certain times of the year.  Allergic rhinitis is caused when allergens in the air trigger the release of histamine in the body.  Histamine causes itching, swelling, and fluid to build up in the fragile linings of the nasal passages, sinuses and eyelids.  An itchy nose and clear discharge are common.  I recommend the following over the counter treatments: You should take a daily dose of antihistamine - I have prescribed Zyrtec  I also would recommend a nasal spray: Flonase 2 sprays into each nostril once daily - I have prescribed this  You may also benefit from eye drops such as: Pataday 1 drop each eye twice daily as needed - I have prescribed this  Using all 3 of these together on a regular basis (daily about the same time) for the next several weeks will help significantly!  HOME CARE:   You can use an over-the-counter saline nasal spray as needed  Avoid areas where there is heavy dust, mites, or molds  Stay indoors on windy days during the pollen season  Keep windows closed in home, at least in bedroom; use air conditioner.  Use high-efficiency house air filter  Keep windows closed in car, turn AC on re-circulate  Avoid playing out with dog during pollen season  GET HELP RIGHT AWAY IF:   If your symptoms do not improve within 10 days  You become short of breath  You  develop yellow or green discharge from your nose for over 3 days  You have coughing fits  MAKE SURE YOU:   Understand these instructions  Will watch your condition  Will get help right away if you are not doing well or get worse  Thank you for choosing an e-visit. Your e-visit answers were reviewed by a board certified advanced clinical practitioner to complete your personal care plan. Depending upon the condition, your plan could have included both over the counter or prescription medications. Please review your pharmacy choice. Be sure that the pharmacy you have chosen is open so that you can pick up your prescription now.  If there is a problem you may message your provider in MyChart to have the prescription routed to another pharmacy. Your safety is important to [place]. If you have drug allergies check your prescription carefully.  For the next 24 hours, you can use MyChart to ask questions about today's visit, request a non-urgent call back, or ask for a work or school excuse from your e-visit provider. You will get an email in the next two days asking about your experience. I hope that your e-visit has been valuable and will speed your recovery.  Greater than 5 minutes, yet less than 10 minutes of time have been spent researching, coordinating, and implementing care for this patient today

## 2020-02-28 ENCOUNTER — Encounter: Payer: Self-pay | Admitting: Family Medicine

## 2020-03-02 ENCOUNTER — Other Ambulatory Visit: Payer: Self-pay | Admitting: Family Medicine

## 2020-03-02 MED ORDER — OLOPATADINE HCL 0.2 % OP SOLN: 1.0000 [drp] | Freq: Every day | OPHTHALMIC | 0 refills | Status: DC

## 2020-03-02 MED ORDER — FLUTICASONE PROPIONATE 50 MCG/ACT NA SUSP: 2.0000 | Freq: Every day | NASAL | 0 refills | Status: DC

## 2020-03-03 ENCOUNTER — Other Ambulatory Visit: Payer: Self-pay

## 2020-03-03 ENCOUNTER — Encounter: Payer: Self-pay | Admitting: Family Medicine

## 2020-03-03 ENCOUNTER — Ambulatory Visit (INDEPENDENT_AMBULATORY_CARE_PROVIDER_SITE_OTHER): Payer: 59 | Admitting: Family Medicine

## 2020-03-03 VITALS — BP 122/82 | HR 68 | Ht 65.0 in | Wt 154.8 lb

## 2020-03-03 DIAGNOSIS — J302 Other seasonal allergic rhinitis: Secondary | ICD-10-CM | POA: Diagnosis not present

## 2020-03-03 DIAGNOSIS — L989 Disorder of the skin and subcutaneous tissue, unspecified: Secondary | ICD-10-CM | POA: Insufficient documentation

## 2020-03-03 DIAGNOSIS — Z0000 Encounter for general adult medical examination without abnormal findings: Secondary | ICD-10-CM

## 2020-03-03 HISTORY — DX: Disorder of the skin and subcutaneous tissue, unspecified: L98.9

## 2020-03-03 MED ORDER — CETIRIZINE HCL 10 MG PO TABS: 10.0000 mg | ORAL_TABLET | Freq: Every day | ORAL | 5 refills | Status: DC

## 2020-03-03 MED ORDER — OLOPATADINE HCL 0.2 % OP SOLN: 1.0000 [drp] | Freq: Every day | OPHTHALMIC | 5 refills | Status: DC

## 2020-03-03 NOTE — Assessment & Plan Note (Signed)
Patient's seasonal allergies have been acting up over the last few months.  She takes Zyrtec, Flonase, and uses olopatadine eyedrops.  She says the olopatadine eyedrops do not last her very long at all.  I do not see any larger sizes, but did make note in her her refill to give her the largest available vessel for her eyedrops. -Refilled allergy medication

## 2020-03-03 NOTE — Progress Notes (Signed)
    SUBJECTIVE:   CHIEF COMPLAINT / HPI:   Pap: Patient believes that she was due for her Pap smear, but after looking at her previous Pap smear, she had HPV cotesting that was negative and was not due for another 2 years.  She will wait until it is due in 2 years.  Seasonal allergies: Patient has long-term seasonal allergies that are currently affecting her.  She gets itchy/red eyes, sore throat, occasional sneezing.  No respiratory symptoms.  She says that her olopatadine eyedrops only last her a few drops and would like a larger bottle if possible.  Left leg lesion: Patient noticed a lesion on her left shin a few days ago after being outside.  It has been pruritic since that time.  She did not notice any vesicles or weeping.  She did say that it may have been bleeding after she scratched it.  Never smoked cigarettes.   Patient had her last Covid shot a few weeks ago.  First shot was in April and second shot was in May.  She got the [employer] vaccine.  PERTINENT  PMH / PSH: Seasonal allergies  OBJECTIVE:   BP 122/82   Pulse 68   Ht 5\' 5"  (1.651 m)   Wt 154 lb 12.8 oz (70.2 kg)   LMP 02/07/2015   SpO2 97%   BMI 25.76 kg/m   General: Alert and oriented. CV: Regular rate and rhythm.  No murmurs. Pulmonary: Lungs clear to auscultation bilaterally.  No wheezes or crackles. Abdomen: Soft, nontender.  Normal bowel sounds. MSK: 5/5 strength bilaterally upper and lower extremities. Skin: Patient has a small 5 mm circular flat lesion on the lateral aspect of her left shin.  No vesicle.  No bleeding.  ASSESSMENT/PLAN:   Skin lesion Occurring on the left shin in the anterior lateral region.  Small, circular, flat.  Appears to have been a blister at some point, but patient denies any drainage of fluid, but did say it may have been bleeding at 1 point.  It is pruritic and patient has tried using hydrocortisone cream on it with no effect.  Appears to have been an insect bite of some kind.  See  media tab for picture. -Advised patient to use over-the-counter Benadryl cream to help with pruritus. -Advised patient to come back in 1 month for reevaluation if it is not improved.  Healthcare maintenance Patient is overall very healthy and up-to-date on her vaccinations and routine health maintenance screening.  Will get BMP and lipid panel.  Previous lipid panel showed elevated LDL 2 years ago. -Follow-up on lab results -Pap smear in 2 years, 5 years after last Pap smear.  Seasonal allergies Patient's seasonal allergies have been acting up over the last few months.  She takes Zyrtec, Flonase, and uses olopatadine eyedrops.  She says the olopatadine eyedrops do not last her very long at all.  I do not see any larger sizes, but did make note in her her refill to give her the largest available vessel for her eyedrops. -Refilled allergy medication     04/09/2015, MD [facility]

## 2020-03-03 NOTE — Patient Instructions (Addendum)
It was nice to meet you today,  I have refilled your allergy medications.  The lesion on your leg looks like a insect bite.  You can try over-the-counter calamine lotion or Benadryl cream to help with the itching.  If this is still present a month from now or gets larger, you can schedule another visit so that we can do further work-up.  I have gotten a lipid panel and BMP for routine lab work.  You appear to be up-to-date on all of your other vaccinations and testing.  I will call you when I get the results of these tests.  Please follow-up as needed.  Have a great day,  Frederic Jericho, MD

## 2020-03-03 NOTE — Assessment & Plan Note (Signed)
Occurring on the left shin in the anterior lateral region.  Small, circular, flat.  Appears to have been a blister at some point, but patient denies any drainage of fluid, but did say it may have been bleeding at 1 point.  It is pruritic and patient has tried using hydrocortisone cream on it with no effect.  Appears to have been an insect bite of some kind.  See media tab for picture. -Advised patient to use over-the-counter Benadryl cream to help with pruritus. -Advised patient to come back in 1 month for reevaluation if it is not improved.

## 2020-03-03 NOTE — Assessment & Plan Note (Signed)
Patient is overall very healthy and up-to-date on her vaccinations and routine health maintenance screening.  Will get BMP and lipid panel.  Previous lipid panel showed elevated LDL 2 years ago. -Follow-up on lab results -Pap smear in 2 years, 5 years after last Pap smear.

## 2020-03-04 LAB — BASIC METABOLIC PANEL
BUN/Creatinine Ratio: 11 (ref 9–23)
BUN: 10 mg/dL (ref 6–24)
CO2: 24 mmol/L (ref 20–29)
Calcium: 9.8 mg/dL (ref 8.7–10.2)
Chloride: 104 mmol/L (ref 96–106)
Creatinine, Ser: 0.94 mg/dL (ref 0.57–1.00)
GFR calc Af Amer: 78 mL/min/{1.73_m2} (ref >59)
GFR calc non Af Amer: 68 mL/min/{1.73_m2} (ref >59)
Glucose: 98 mg/dL (ref 65–99)
Potassium: 4.4 mmol/L (ref 3.5–5.2)
Sodium: 143 mmol/L (ref 134–144)

## 2020-03-04 LAB — LIPID PANEL
Chol/HDL Ratio: 4.4 ratio (ref 0.0–4.4)
Cholesterol, Total: 209 mg/dL — ABNORMAL HIGH (ref 100–199)
HDL: 48 mg/dL (ref >39)
LDL Chol Calc (NIH): 142 mg/dL — ABNORMAL HIGH (ref 0–99)
Triglycerides: 105 mg/dL (ref 0–149)
VLDL Cholesterol Cal: 19 mg/dL (ref 5–40)

## 2020-03-17 ENCOUNTER — Other Ambulatory Visit: Payer: Self-pay | Admitting: Family Medicine

## 2020-10-30 ENCOUNTER — Encounter: Payer: Self-pay | Admitting: Family Medicine

## 2020-10-30 ENCOUNTER — Ambulatory Visit: Payer: 59 | Admitting: Family Medicine

## 2020-10-30 ENCOUNTER — Other Ambulatory Visit: Payer: Self-pay

## 2020-10-30 VITALS — BP 124/86 | HR 67 | Wt 151.0 lb

## 2020-10-30 DIAGNOSIS — B9689 Other specified bacterial agents as the cause of diseases classified elsewhere: Secondary | ICD-10-CM

## 2020-10-30 DIAGNOSIS — H6981 Other specified disorders of Eustachian tube, right ear: Secondary | ICD-10-CM

## 2020-10-30 DIAGNOSIS — Z1231 Encounter for screening mammogram for malignant neoplasm of breast: Secondary | ICD-10-CM

## 2020-10-30 DIAGNOSIS — H9201 Otalgia, right ear: Secondary | ICD-10-CM

## 2020-10-30 DIAGNOSIS — J019 Acute sinusitis, unspecified: Secondary | ICD-10-CM

## 2020-10-30 MED ORDER — AMOXICILLIN-POT CLAVULANATE 875-125 MG PO TABS: 1.0000 | ORAL_TABLET | Freq: Two times a day (BID) | ORAL | 0 refills | Status: DC

## 2020-10-30 MED ORDER — MOMETASONE FUROATE 50 MCG/ACT NA SUSP: 2.0000 | Freq: Every day | NASAL | 12 refills | Status: DC

## 2020-10-30 MED ORDER — LORATADINE 10 MG PO TABS: 10.0000 mg | ORAL_TABLET | Freq: Every day | ORAL | 11 refills | Status: DC

## 2020-10-30 NOTE — Progress Notes (Signed)
    SUBJECTIVE:   CHIEF COMPLAINT / HPI:   Debra Jones is a pleasant 55 year old with history of allergic rhinitis presenting with right ear pain.  This is been ongoing for 3 days.  The pain is intermittent.  She has no drainage from the ear.  She was evaluated by her physician at her doctor's office and told she had a a lot of fluid behind her ear.  She denies headache, dental problems, fevers, nausea, vomiting. Pain improved with Tylenol. She endorses major contributor is 'sinuses' she reports ongoing sinus issues for many years.  This is worsened over the past few weeks.  She denies fevers or green drainage.  She uses Flonase intermittently and tries to take Zyrtec regularly.  The patient is due for her mammogram in March.  The patient has had her weight fluctuates slightly but by her weight is consistently down since 2020.  She reports she is not been trying to lose weight.  She denies fevers, night sweats. She has changed jobs and has less stress.    PERTINENT  PMH / PSH/Family/Social History : Reviewed and updated- FHx notable for breast cancer in mother   OBJECTIVE:   BP 124/86   Pulse 67   Wt 151 lb (68.5 kg)   LMP 02/07/2015   SpO2 99%   BMI 25.13 kg/m   Today's weight:  Last Weight  Most recent update: 10/30/2020 11:31 AM   Weight  68.5 kg (151 lb)           Review of prior weights: [employer]   10/30/20 1130  Weight: 151 lb (68.5 kg)   Bilateral TM R EAC clear, TM with + clear effusion and small bullae  L EAC clear and TM with small effusion  No erythema or purulent drainage behind TM  Cardiac: Regular rate and rhythm. Normal S1/S2. No murmurs, rubs, or gallops appreciated. Lungs: Clear bilaterally to ascultation.  Psych: Pleasant and appropriate    ASSESSMENT/PLAN:  Debra Jones, most likely related to eustachian tube dysfunction and sinus disease.  We discussed symptomatic management at length.  Pain control with naproxen.  If not improving delayed  prescription for ABX for acute sinusitis given given her ongoing sinus issues.  Also considered TMJ syndrome although this is less likely given her clinical exam, dental concerns although dental exam is notable for chronic dental changes and no acute concerns.  No signs of otitis externa or herpes lesions.  Healthcare maintenance mammogram has been ordered Discussed that if weight continues to drop (down just 4 pounds) she is to call immediately   Terisa Starr, MD  Family Medicine Teaching Service  [facility]

## 2020-10-30 NOTE — Patient Instructions (Signed)
It was wonderful to see you today.  Please bring ALL of your medications with you to every visit.   Today we talked about:  --Switching up your allergy pills - Mometasone nasal spray twice per day every day - Naproxen with food as needed for ear pain - Claritin instead of cetirizine   If your symptoms do not improve, you can fill your antibiotic prescription   Thank you for choosing [facility].   Please call [phone] with any questions about today's appointment.  Please be sure to schedule follow up at the front  desk before you leave today.   Terisa Starr, MD  Family Medicine

## 2020-12-28 ENCOUNTER — Other Ambulatory Visit: Payer: Self-pay | Admitting: Family Medicine

## 2020-12-28 ENCOUNTER — Other Ambulatory Visit: Payer: Self-pay

## 2020-12-28 ENCOUNTER — Ambulatory Visit
Admission: RE | Admit: 2020-12-28 | Discharge: 2020-12-28 | Disposition: A | Discharge status: Home / Self Care | Payer: Self-pay | Source: Ambulatory Visit | Attending: Family Medicine | Admitting: Family Medicine

## 2020-12-28 DIAGNOSIS — Z1231 Encounter for screening mammogram for malignant neoplasm of breast: Secondary | ICD-10-CM

## 2021-03-14 ENCOUNTER — Other Ambulatory Visit: Payer: Self-pay | Admitting: Family Medicine

## 2021-06-28 ENCOUNTER — Other Ambulatory Visit: Payer: Self-pay | Admitting: Plastic Surgery

## 2021-07-30 ENCOUNTER — Ambulatory Visit (INDEPENDENT_AMBULATORY_CARE_PROVIDER_SITE_OTHER): Payer: No Typology Code available for payment source | Admitting: Family Medicine

## 2021-07-30 ENCOUNTER — Other Ambulatory Visit: Payer: Self-pay

## 2021-07-30 VITALS — BP 110/60 | HR 75 | Ht 64.0 in | Wt 154.1 lb

## 2021-07-30 DIAGNOSIS — L299 Pruritus, unspecified: Secondary | ICD-10-CM | POA: Diagnosis not present

## 2021-07-30 MED ORDER — BENADRYL ITCH STOPPING 1-0.1 % EX CREA: TOPICAL_CREAM | Freq: Three times a day (TID) | CUTANEOUS | 0 refills | Status: DC | PRN

## 2021-07-30 NOTE — Progress Notes (Signed)
    SUBJECTIVE:   CHIEF COMPLAINT / HPI:   Pruritis of face - couple days duration - no pain but itchy - no rash  - changes her soap, started using ivory for sensitive skin x2 weeks - started using All sensitive skin for clothes x2 weeks - had starting itching all over and that's what prompted her to switch soaps - has been using Eucerin cream after showers - will use vaseline on face  - has turned on furnace this season already  PERTINENT  PMH / PSH: Seasonal allergies, skin lesion, trichomonas  OBJECTIVE:   BP 110/60   Pulse 75   Ht 5\' 4"  (1.626 m)   Wt 154 lb 2 oz (69.9 kg)   LMP 02/07/2015   SpO2 97%   BMI 26.46 kg/m   PHQ-9:  Depression screen [facility] 2/9 07/30/2021 03/03/2020 03/14/2017  Decreased Interest 0 0 0  Down, Depressed, Hopeless 0 0 0  PHQ - 2 Score 0 0 0  Altered sleeping 0 - -  Tired, decreased energy 0 - -  Change in appetite 0 - -  Feeling bad or failure about yourself  0 - -  Trouble concentrating 0 - -  Moving slowly or fidgety/restless 0 - -  Suicidal thoughts 0 - -  PHQ-9 Score 0 - -  Difficult doing work/chores Not difficult at all - -     GAD-7: No flowsheet data found.   Physical Exam General: Awake, alert, oriented, no acute distress Respiratory: Unlabored respirations, speaking in full sentences, no respiratory distress Skin: minimal areas of dry skin overlying proximal forearms, no rashes or lesions overlying left cheek  ASSESSMENT/PLAN:   Pruritus Suspect due to dry skin secondary to seasonal heater use.  Continue sensitive skin soaps and moisturizers such as Vaseline, Eucerin, Aquaphor.  Will prescribe Benadryl cream as needed for use over left cheek.  Patient to return if she develops rash or lesions.     03/16/2017, MD [facility]

## 2021-07-30 NOTE — Patient Instructions (Addendum)
It was wonderful to meet you today. Thank you for allowing me to be a part of your care. Below is a short summary of what we discussed at your visit today:  Itching of face - no rash or anything that looks like an infection or bug bites - use the benadryl cream as needed over the itchy area - you may also take benadryl 25 mg at bedtime to help with itching if needed - come back if you develop a rash, sores, or lesions that look like bites  Health Maintenance We like to think about ways to keep you healthy for years to come. Below are some interventions and screenings we can offer to keep you healthy: - PAP smear (please schedule at your convenience) - Shingles vaccine (we would send script to pharmacy)   Please bring all of your medications to every appointment!  If you have any questions or concerns, please do not hesitate to contact us via phone or MyChart message.     Fayette Pho, MD

## 2021-07-30 NOTE — Assessment & Plan Note (Signed)
Suspect due to dry skin secondary to seasonal heater use.  Continue sensitive skin soaps and moisturizers such as Vaseline, Eucerin, Aquaphor.  Will prescribe Benadryl cream as needed for use over left cheek.  Patient to return if she develops rash or lesions.

## 2022-01-23 ENCOUNTER — Other Ambulatory Visit: Payer: Self-pay

## 2022-01-25 MED ORDER — CETIRIZINE HCL 10 MG PO TABS: 10.0000 mg | ORAL_TABLET | Freq: Every day | ORAL | 5 refills | Status: DC

## 2022-01-29 ENCOUNTER — Other Ambulatory Visit: Payer: Self-pay | Admitting: Family Medicine

## 2022-01-29 DIAGNOSIS — Z1231 Encounter for screening mammogram for malignant neoplasm of breast: Secondary | ICD-10-CM

## 2022-01-30 ENCOUNTER — Encounter: Payer: Self-pay | Admitting: Student

## 2022-01-30 ENCOUNTER — Encounter: Payer: Self-pay | Admitting: Family Medicine

## 2022-01-31 ENCOUNTER — Ambulatory Visit: Payer: No Typology Code available for payment source

## 2022-02-01 ENCOUNTER — Ambulatory Visit (INDEPENDENT_AMBULATORY_CARE_PROVIDER_SITE_OTHER): Payer: No Typology Code available for payment source | Admitting: Student

## 2022-02-01 ENCOUNTER — Ambulatory Visit
Admission: RE | Admit: 2022-02-01 | Discharge: 2022-02-01 | Disposition: A | Discharge status: Home / Self Care | Payer: No Typology Code available for payment source | Source: Ambulatory Visit | Attending: Family Medicine | Admitting: Family Medicine

## 2022-02-01 ENCOUNTER — Encounter: Payer: Self-pay | Admitting: Student

## 2022-02-01 ENCOUNTER — Other Ambulatory Visit: Payer: Self-pay | Admitting: Student

## 2022-02-01 VITALS — BP 132/84 | HR 87 | Wt 154.0 lb

## 2022-02-01 DIAGNOSIS — Z0000 Encounter for general adult medical examination without abnormal findings: Secondary | ICD-10-CM | POA: Diagnosis not present

## 2022-02-01 DIAGNOSIS — Z13228 Encounter for screening for other metabolic disorders: Secondary | ICD-10-CM | POA: Diagnosis not present

## 2022-02-01 DIAGNOSIS — J302 Other seasonal allergic rhinitis: Secondary | ICD-10-CM | POA: Diagnosis not present

## 2022-02-01 DIAGNOSIS — Z1231 Encounter for screening mammogram for malignant neoplasm of breast: Secondary | ICD-10-CM

## 2022-02-01 MED ORDER — LORATADINE 10 MG PO TABS: 10.0000 mg | ORAL_TABLET | Freq: Every day | ORAL | 11 refills | Status: DC

## 2022-02-01 NOTE — Patient Instructions (Signed)
It was great to see you! Thank you for allowing me to participate in your care!  ? ?I recommend that you always bring your medications to each appointment as this makes it easy to ensure we are on the correct medications and helps [place] not miss when refills are needed. ? ?Our plans for today:  ?- I refilled your loratadine ?- I will call you with lab results ? ?We are checking some labs today, I will call you if they are abnormal will send you a MyChart message or a letter if they are normal.  If you do not hear about your labs in the next 2 weeks please let us know. ? ?Take care and seek immediate care sooner if you develop any concerns. Please remember to show up 15 minutes before your scheduled appointment time! ? ?Levin Erp, MD ?[facility]  ?

## 2022-02-01 NOTE — Progress Notes (Signed)
? ? ?  SUBJECTIVE:  ? ?CHIEF COMPLAINT / HPI: Check up ? ?Would like to have a check up. No acute complaints today. Has not had her pap this year but is due. Would like to schedule this for a later time. Has received shingle vaccine at [facility]. Mammogram completed recently. ? ?PERTINENT  PMH / PSH: seasonal allergies ? ?OBJECTIVE:  ? ?BP 132/84   Pulse 87   Wt 154 lb (69.9 kg)   LMP 02/07/2015   BMI 26.43 kg/m?   ?General: Well appearing, NAD, awake, alert, responsive to questions ?Head: Normocephalic atraumatic ?CV: Regular rate and rhythm no murmurs rubs or gallops ?Respiratory: Clear to ausculation bilaterally, no wheezes rales or crackles, chest rises symmetrically,  no increased work of breathing ?Abdomen: Soft, non-tender, non-distended, normoactive bowel sounds  ?Extremities: Moves upper and lower extremities freely, no edema in LE ?Neuro: No focal deficits ?Skin: No rashes or lesions visualized  ? ?ASSESSMENT/PLAN:  ? ?No problem-specific Assessment & Plan notes found for this encounter. ? 1. Seasonal allergies ?Some sneezing/itchy eyes ?- loratadine (CLARITIN) 10 MG tablet; Take 1 tablet (10 mg total) by mouth daily.  Dispense: 90 tablet; Refill: 11 ? ?2. Healthcare maintenance ?Follow up for pap smear ? ?3. Encounter for screening for other metabolic disorders ?Has had high lipid panel in past but ASCVD in past was low. Discussed nutrition with patient ?- CBC ?- Basic Metabolic Panel ?- Lipid Panel  ? ? ?Levin Erp, MD ?[facility]  ?

## 2022-02-02 LAB — BASIC METABOLIC PANEL
BUN/Creatinine Ratio: 15 (ref 9–23)
BUN: 14 mg/dL (ref 6–24)
CO2: 26 mmol/L (ref 20–29)
Calcium: 10 mg/dL (ref 8.7–10.2)
Chloride: 106 mmol/L (ref 96–106)
Creatinine, Ser: 0.94 mg/dL (ref 0.57–1.00)
Glucose: 125 mg/dL — ABNORMAL HIGH (ref 70–99)
Potassium: 4 mmol/L (ref 3.5–5.2)
Sodium: 144 mmol/L (ref 134–144)
eGFR: 70 mL/min/{1.73_m2} (ref >59)

## 2022-02-02 LAB — CBC
Hematocrit: 41.2 % (ref 34.0–46.6)
Hemoglobin: 14.2 g/dL (ref 11.1–15.9)
MCH: 29.3 pg (ref 26.6–33.0)
MCHC: 34.5 g/dL (ref 31.5–35.7)
MCV: 85 fL (ref 79–97)
Platelets: 304 10*3/uL (ref 150–450)
RBC: 4.85 x10E6/uL (ref 3.77–5.28)
RDW: 13 % (ref 11.7–15.4)
WBC: 8.9 10*3/uL (ref 3.4–10.8)

## 2022-02-02 LAB — LIPID PANEL
Chol/HDL Ratio: 4.1 ratio (ref 0.0–4.4)
Cholesterol, Total: 199 mg/dL (ref 100–199)
HDL: 48 mg/dL (ref >39)
LDL Chol Calc (NIH): 119 mg/dL — ABNORMAL HIGH (ref 0–99)
Triglycerides: 185 mg/dL — ABNORMAL HIGH (ref 0–149)
VLDL Cholesterol Cal: 32 mg/dL (ref 5–40)

## 2022-02-04 ENCOUNTER — Encounter: Payer: Self-pay | Admitting: Student

## 2022-02-19 NOTE — Progress Notes (Unsigned)
    SUBJECTIVE:   CHIEF COMPLAINT / HPI:   Vaginal Discharge: Patient is a 56 y.o. female presenting with vaginal discharge for *** days.  She states the discharge is of *** consistency.  She endorses *** vaginal odor.  She is not interested in screening for sexually transmitted infections today.  PERTINENT  PMH / PSH: ***None relevant  OBJECTIVE:   LMP 02/07/2015    General: NAD, pleasant, able to participate in exam Respiratory: Normal effort, no obvious respiratory distress Pelvic: VULVA: normal appearing vulva with no masses, tenderness or lesions, VAGINA: Normal appearing vagina with normal color, no lesions, with {GYN VAGINAL DISCHARGE:21986} discharge present, ***CERVIX: No lesions, {GYN VAGINAL DISCHARGE:21986} discharge present,  Chaperone *** present for pelvic exam  ASSESSMENT/PLAN:   No problem-specific Assessment & Plan notes found for this encounter.    Assessment:  56 y.o. female with vaginal discharge for***days, as well as***.  Physical exam significant for*** discharge.  Wet prep performed today shows *** consistent with ***.  Patient is not interested in STI screening.   Plan: -Wet prep as above.  Will treat with***. -Follow-up as needed  Levin Erp, MD [facility]

## 2022-02-21 ENCOUNTER — Ambulatory Visit (INDEPENDENT_AMBULATORY_CARE_PROVIDER_SITE_OTHER): Payer: No Typology Code available for payment source | Admitting: Student

## 2022-02-21 ENCOUNTER — Other Ambulatory Visit (HOSPITAL_COMMUNITY)
Admission: RE | Admit: 2022-02-21 | Discharge: 2022-02-21 | Disposition: A | Discharge status: Home / Self Care | Payer: No Typology Code available for payment source | Source: Ambulatory Visit | Attending: Family Medicine | Admitting: Family Medicine

## 2022-02-21 ENCOUNTER — Encounter: Payer: Self-pay | Admitting: Student

## 2022-02-21 VITALS — BP 134/95 | HR 70 | Wt 152.0 lb

## 2022-02-21 DIAGNOSIS — N898 Other specified noninflammatory disorders of vagina: Secondary | ICD-10-CM | POA: Diagnosis not present

## 2022-02-21 DIAGNOSIS — N760 Acute vaginitis: Secondary | ICD-10-CM

## 2022-02-21 DIAGNOSIS — Z0000 Encounter for general adult medical examination without abnormal findings: Secondary | ICD-10-CM

## 2022-02-21 DIAGNOSIS — J302 Other seasonal allergic rhinitis: Secondary | ICD-10-CM | POA: Diagnosis not present

## 2022-02-21 DIAGNOSIS — B9689 Other specified bacterial agents as the cause of diseases classified elsewhere: Secondary | ICD-10-CM

## 2022-02-21 DIAGNOSIS — E785 Hyperlipidemia, unspecified: Secondary | ICD-10-CM | POA: Diagnosis not present

## 2022-02-21 LAB — POCT WET PREP (WET MOUNT)
Clue Cells Wet Prep Whiff POC: POSITIVE
Trichomonas Wet Prep HPF POC: ABSENT

## 2022-02-21 MED ORDER — METRONIDAZOLE 500 MG PO TABS: 500.0000 mg | ORAL_TABLET | Freq: Two times a day (BID) | ORAL | 0 refills | Status: AC

## 2022-02-21 MED ORDER — OLOPATADINE HCL 0.2 % OP SOLN: OPHTHALMIC | 8 refills | Status: DC

## 2022-02-21 NOTE — Patient Instructions (Addendum)
It was great to see you! Thank you for allowing me to participate in your care!   Our plans for today:  - We got the pap smear and STD testing I will follow up with you on these results  Take care and seek immediate care sooner if you develop any concerns. Please remember to show up 15 minutes before your scheduled appointment time!  Levin Erp, MD [facility]

## 2022-02-21 NOTE — Assessment & Plan Note (Signed)
Discussed that ASCVD risk was 5.4% with patient and the moderate intensity statin recommended.  Discussed benefits of starting statin, patient would like to start with diet and lifestyle modifications first before starting statin medication. -Monitor

## 2022-02-22 LAB — HIV ANTIBODY (ROUTINE TESTING W REFLEX): HIV Screen 4th Generation wRfx: NONREACTIVE

## 2022-02-22 LAB — RPR: RPR Ser Ql: NONREACTIVE

## 2022-02-26 LAB — CYTOLOGY - PAP
Adequacy: ABSENT
Comment: NEGATIVE
Diagnosis: NEGATIVE
High risk HPV: NEGATIVE

## 2022-03-05 ENCOUNTER — Encounter: Payer: Self-pay | Admitting: *Deleted

## 2022-12-20 ENCOUNTER — Other Ambulatory Visit: Payer: Self-pay | Admitting: Diagnostic Radiology

## 2022-12-20 DIAGNOSIS — Z1231 Encounter for screening mammogram for malignant neoplasm of breast: Secondary | ICD-10-CM

## 2023-02-04 ENCOUNTER — Ambulatory Visit
Admission: RE | Admit: 2023-02-04 | Discharge: 2023-02-04 | Disposition: A | Discharge status: Home / Self Care | Payer: 59 | Source: Ambulatory Visit | Attending: Diagnostic Radiology | Admitting: Diagnostic Radiology

## 2023-02-04 DIAGNOSIS — Z1231 Encounter for screening mammogram for malignant neoplasm of breast: Secondary | ICD-10-CM | POA: Diagnosis not present

## 2023-02-08 ENCOUNTER — Other Ambulatory Visit: Payer: Self-pay | Admitting: Student

## 2023-02-08 DIAGNOSIS — J302 Other seasonal allergic rhinitis: Secondary | ICD-10-CM

## 2023-04-18 ENCOUNTER — Ambulatory Visit (INDEPENDENT_AMBULATORY_CARE_PROVIDER_SITE_OTHER): Payer: 59 | Admitting: Student

## 2023-04-18 ENCOUNTER — Other Ambulatory Visit (HOSPITAL_COMMUNITY)
Admission: RE | Admit: 2023-04-18 | Discharge: 2023-04-18 | Disposition: A | Discharge status: Home / Self Care | Payer: 59 | Source: Ambulatory Visit | Attending: Family Medicine | Admitting: Family Medicine

## 2023-04-18 VITALS — BP 118/76 | HR 76 | Ht 64.0 in | Wt 145.8 lb

## 2023-04-18 DIAGNOSIS — N898 Other specified noninflammatory disorders of vagina: Secondary | ICD-10-CM | POA: Insufficient documentation

## 2023-04-18 NOTE — Patient Instructions (Signed)
It was wonderful to see you today. Thank you for allowing me to be a part of your care. Below is a short summary of what we discussed at your visit today:  Due to your vaginal discharge we completed a vaginal swab today for STD, fungi and BV test  We will follow up with you on the result.   Please bring all of your medications to every appointment!  If you have any questions or concerns, please do not hesitate to contact us via phone or MyChart message.   Jerre Simon, MD [facility]

## 2023-04-18 NOTE — Progress Notes (Signed)
    SUBJECTIVE:   CHIEF COMPLAINT / HPI:   57 y.o.  year old female presents Vaginal discharge that started 5 days ago.  She also endorses increased frequency and urgency.  She is sexually active with 1 partner and inconsistent with protection. Denies vaginal irritation, burning, itchiness or odor.  No hematuria or recent change in medication. LMP: 2016    PERTINENT  PMH / PSH: Reviewed   OBJECTIVE:   BP 118/76   Pulse 76   Ht 5\' 4"  (1.626 m)   Wt 145 lb 12.8 oz (66.1 kg)   LMP 02/07/2015   SpO2 98%   BMI 25.03 kg/m    Physical Exam General: Alert, well appearing, NAD, Oriented x4 Cardiovascular: RRR, No Murmurs, Normal S2/S2 Respiratory: CTAB, No wheezing or Rales Abdomen: No distension or tenderness Genitalia:  Normal introitus for age, no external lesions, white copious vaginal discharge, mucosa pink and moist, no vaginal or cervical lesions, no vaginal atrophy, no friaility or hemorrhage  Chaperon for Exam: April CMA  ASSESSMENT/PLAN:   Vaginal discharge  Sexually active patient with complain of vaginal discharge. Pelvic exam was positive for copious white discharge but without lesions. Will obtain lab for STD testing.  -Follow up with lab for GC, Chlamydia, Trichomonas. - Patient declined HIV and RPR         Jerre Simon, MD [facility]

## 2023-04-21 LAB — CERVICOVAGINAL ANCILLARY ONLY
Bacterial Vaginitis (gardnerella): NEGATIVE
Candida Glabrata: NEGATIVE
Candida Vaginitis: NEGATIVE
Chlamydia: NEGATIVE
Comment: NEGATIVE
Comment: NEGATIVE
Comment: NEGATIVE
Comment: NEGATIVE
Comment: NEGATIVE
Comment: NORMAL
Neisseria Gonorrhea: NEGATIVE
Trichomonas: POSITIVE — AB

## 2023-04-23 ENCOUNTER — Telehealth: Payer: Self-pay | Admitting: Student

## 2023-04-23 DIAGNOSIS — A599 Trichomoniasis, unspecified: Secondary | ICD-10-CM

## 2023-04-23 MED ORDER — METRONIDAZOLE 500 MG PO TABS: 500.0000 mg | ORAL_TABLET | Freq: Two times a day (BID) | ORAL | 0 refills | Status: AC

## 2023-04-23 NOTE — Telephone Encounter (Signed)
Called and informed patient that her recent labs was positive for Trichomonas.  Sent in prescription for metronidazole 500 mg twice daily for 7 days.  Also stressed to patient that her partner needs to be treated as well to avoid reinfection after treatment.  Patient verbalized understanding and agreeable to plan.

## 2023-05-19 ENCOUNTER — Ambulatory Visit (INDEPENDENT_AMBULATORY_CARE_PROVIDER_SITE_OTHER): Payer: 59 | Admitting: Student

## 2023-05-19 ENCOUNTER — Encounter: Payer: Self-pay | Admitting: Student

## 2023-05-19 ENCOUNTER — Other Ambulatory Visit (HOSPITAL_COMMUNITY)
Admission: RE | Admit: 2023-05-19 | Discharge: 2023-05-19 | Disposition: A | Discharge status: Home / Self Care | Payer: 59 | Source: Ambulatory Visit | Attending: Family Medicine | Admitting: Family Medicine

## 2023-05-19 VITALS — BP 120/72 | HR 74 | Temp 98.1°F | Resp 16 | Wt 144.8 lb

## 2023-05-19 DIAGNOSIS — Z113 Encounter for screening for infections with a predominantly sexual mode of transmission: Secondary | ICD-10-CM | POA: Insufficient documentation

## 2023-05-19 LAB — POCT WET PREP (WET MOUNT)
Clue Cells Wet Prep Whiff POC: NEGATIVE
Trichomonas Wet Prep HPF POC: ABSENT

## 2023-05-19 NOTE — Patient Instructions (Addendum)
It was wonderful to see you today. Thank you for allowing me to be a part of your care. Below is a short summary of what we discussed at your visit today:  Glad to hear your symptoms have improved and you completed the antibiotics.  Today we collected sample to make sure you are adequately treated.  We will follow-up with you with the results.  If you have any questions or concerns, please do not hesitate to contact us via phone or MyChart message.   Jerre Simon, MD [facility]

## 2023-05-19 NOTE — Progress Notes (Signed)
    SUBJECTIVE:   CHIEF COMPLAINT / HPI:   Patient is a 57 year old female presenting today for follow-up of STD treatment.  Was recently found to have trichomonas and treated with a 7-day course of metronidazole.  Patient endorses, compliance with her medication and resolution of symptoms.  PERTINENT  PMH / PSH: Reviewed   OBJECTIVE:   BP 120/72   Pulse 74   Temp 98.1 F (36.7 C) (Oral)   Resp 16   Wt 65.7 kg   LMP 02/07/2015   SpO2 100%   BMI 24.85 kg/m     Physical Exam General: Alert, well appearing, NAD Cardiovascular:Regular rate and well perfuse  Genitalia:  Normal introitus for age, no external lesions, white discharge in cervical ox, mucosa pink and moist, no vaginal or cervical lesions, no vaginal atrophy, no friaility or hemorrhage, normal uterus size and position   CMA DO served as a chaperon  ASSESSMENT/PLAN:   STD Screening Recently treated for Trichomonas with 7 day course of metronidazole  4 weeks ago. Patient endorse compliance with her medication and currently asymptomatic at this time. Will check recheck for cure.  -Obtained swab for wet prep and GC/Chlamydia -UTD on Pap  Jerre Simon, MD [facility]

## 2023-05-19 NOTE — Progress Notes (Signed)
Patient is here for 1 month follow-up on vaginal discharge. Patient said medication is going well for her. No other concerns today.

## 2023-05-20 ENCOUNTER — Encounter: Payer: Self-pay | Admitting: Student

## 2023-05-20 LAB — CERVICOVAGINAL ANCILLARY ONLY
Chlamydia: NEGATIVE
Comment: NEGATIVE
Comment: NEGATIVE
Comment: NORMAL
Neisseria Gonorrhea: NEGATIVE
Trichomonas: NEGATIVE

## 2023-12-12 ENCOUNTER — Other Ambulatory Visit: Payer: Self-pay | Admitting: Student

## 2023-12-12 DIAGNOSIS — Z1231 Encounter for screening mammogram for malignant neoplasm of breast: Secondary | ICD-10-CM

## 2024-01-19 ENCOUNTER — Other Ambulatory Visit: Payer: Self-pay | Admitting: Student

## 2024-02-06 ENCOUNTER — Ambulatory Visit
Admission: RE | Admit: 2024-02-06 | Discharge: 2024-02-06 | Disposition: A | Discharge status: Home / Self Care | Source: Ambulatory Visit | Attending: Family Medicine | Admitting: Family Medicine

## 2024-02-06 DIAGNOSIS — Z1231 Encounter for screening mammogram for malignant neoplasm of breast: Secondary | ICD-10-CM | POA: Diagnosis not present

## 2024-03-09 ENCOUNTER — Encounter: Payer: Self-pay | Admitting: *Deleted

## 2024-07-19 ENCOUNTER — Other Ambulatory Visit: Payer: Self-pay

## 2024-07-19 DIAGNOSIS — J302 Other seasonal allergic rhinitis: Secondary | ICD-10-CM

## 2024-07-19 MED ORDER — LORATADINE 10 MG PO TABS: 10.0000 mg | ORAL_TABLET | Freq: Every day | ORAL | 3 refills | Status: DC

## 2024-08-12 ENCOUNTER — Telehealth: Payer: Self-pay | Admitting: Student

## 2024-08-12 NOTE — Telephone Encounter (Signed)
 Called and left the patient a voice mail informing her that she has an physical on 11/17 and if she is wanting labs then they can be done at that visit. Also sated if that's not what she was referring to and needed something different then to contact us  back.

## 2024-08-12 NOTE — Telephone Encounter (Signed)
 Patient returns call to nurse line.   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   She is requesting routine physical labs. Advised patient that I would send message to Dr. Howell, however, I was unsure if this is something that could be ordered and completed prior to her physical.   Patient voices understanding.   Chiquita JAYSON English, RN

## 2024-08-12 NOTE — Telephone Encounter (Signed)
 Patinient called asking if her lab work for her physical apppt that she has on 11/17 can go ahead and be sent to [facility] please

## 2024-08-13 NOTE — Telephone Encounter (Signed)
 Called patient, she did not answer.   Mychart message sent.   Chiquita JAYSON English, RN

## 2024-08-16 ENCOUNTER — Ambulatory Visit: Payer: Self-pay | Admitting: Student

## 2024-08-16 ENCOUNTER — Other Ambulatory Visit: Payer: Self-pay | Admitting: Student

## 2024-08-16 ENCOUNTER — Ambulatory Visit: Admitting: Student

## 2024-08-16 ENCOUNTER — Encounter: Payer: Self-pay | Admitting: Student

## 2024-08-16 VITALS — BP 135/82 | HR 67 | Ht 64.0 in | Wt 148.0 lb

## 2024-08-16 DIAGNOSIS — Z1211 Encounter for screening for malignant neoplasm of colon: Secondary | ICD-10-CM

## 2024-08-16 DIAGNOSIS — J302 Other seasonal allergic rhinitis: Secondary | ICD-10-CM | POA: Diagnosis not present

## 2024-08-16 DIAGNOSIS — Z13228 Encounter for screening for other metabolic disorders: Secondary | ICD-10-CM | POA: Diagnosis not present

## 2024-08-16 DIAGNOSIS — E785 Hyperlipidemia, unspecified: Secondary | ICD-10-CM | POA: Diagnosis not present

## 2024-08-16 DIAGNOSIS — Z131 Encounter for screening for diabetes mellitus: Secondary | ICD-10-CM | POA: Diagnosis not present

## 2024-08-16 DIAGNOSIS — H6991 Unspecified Eustachian tube disorder, right ear: Secondary | ICD-10-CM

## 2024-08-16 DIAGNOSIS — Z0000 Encounter for general adult medical examination without abnormal findings: Secondary | ICD-10-CM

## 2024-08-16 LAB — POCT GLYCOSYLATED HEMOGLOBIN (HGB A1C): Hemoglobin A1C: 5.6 % (ref 4.0–5.6)

## 2024-08-16 MED ORDER — MOMETASONE FUROATE 50 MCG/ACT NA SUSP: 2.0000 | Freq: Every day | NASAL | 12 refills | Status: DC

## 2024-08-16 MED ORDER — OLOPATADINE HCL 0.2 % OP SOLN: OPHTHALMIC | 8 refills | Status: AC

## 2024-08-16 MED ORDER — LORATADINE 10 MG PO TABS: 10.0000 mg | ORAL_TABLET | Freq: Every day | ORAL | 3 refills | Status: AC

## 2024-08-16 NOTE — Progress Notes (Signed)
    SUBJECTIVE:   Chief compliant/HPI: annual examination  Debra Jones is a 58 y.o. who presents today for an annual exam.    History tabs reviewed and updated.   Refilled chronic medications.  OBJECTIVE:   BP 135/82   Pulse 67   Ht 5' 4 (1.626 m)   Wt 148 lb (67.1 kg)   LMP 02/07/2015   SpO2 100%   BMI 25.40 kg/m    General: NAD, pleasant  Cardio: RRR, no MRG. Cap Refill <2s. Respiratory: CTAB, normal wob on RA Skin: Warm and dry  ASSESSMENT/PLAN:   Assessment & Plan Annual physical exam BP reviewed and at goal.  Advance directives discussion: Has advance directives.  Considered the following items based upon USPSTF recommendations: Diabetes screening: ordered HIV testing:Not ordered Hepatitis C: Not ordered Hepatitis B: Not ordered Syphilis if at high risk: Not high risk GC/CT not high risk Lipid panel (nonfasting or fasting) discussed based upon AHA recommendations and ordered.  Consider repeat every 4-6 years.   Cancer Screening Discussion  Cervical cancer screening: prior Pap reviewed, repeat due in 2028 Breast cancer screening: recently completed and repeat not yet indicated Lung cancer screening: Not indicated Colorectal cancer screening: Due in January 2026 Vaccinations: Declines flu and pneumococcal.  Follow up in 1 year or sooner if indicated.  MyChart Activation: Already signed up   Gladis Church, DO [facility]

## 2024-08-16 NOTE — Patient Instructions (Signed)
 It was great to see you! Thank you for allowing me to participate in your care!   I recommend that you always bring your medications to each appointment as this makes it easy to ensure we are on the correct medications and helps us  not miss when refills are needed.  Our plans for today:  - You will receive a call from GI to schedule your colonoscopy - We are checking some labs today, I will call you if they are abnormal will send you a MyChart message or a letter if they are normal.  If you do not hear about your labs in the next 2 weeks please let us  know. - Follow-up in 1 year or sooner if needed  Take care and seek immediate care sooner if you develop any concerns. Please remember to show up 15 minutes before your scheduled appointment time!  Gladis Church, DO [facility]

## 2024-08-17 LAB — LIPID PANEL
Chol/HDL Ratio: 3.5 ratio (ref 0.0–4.4)
Cholesterol, Total: 187 mg/dL (ref 100–199)
HDL: 54 mg/dL (ref >39)
LDL Chol Calc (NIH): 119 mg/dL — ABNORMAL HIGH (ref 0–99)
Triglycerides: 78 mg/dL (ref 0–149)
VLDL Cholesterol Cal: 14 mg/dL (ref 5–40)

## 2024-08-17 LAB — BASIC METABOLIC PANEL WITH GFR
BUN/Creatinine Ratio: 19 (ref 12–28)
BUN: 15 mg/dL (ref 8–27)
CO2: 23 mmol/L (ref 20–29)
Calcium: 9.9 mg/dL (ref 8.7–10.3)
Chloride: 103 mmol/L (ref 96–106)
Creatinine, Ser: 0.81 mg/dL (ref 0.57–1.00)
Glucose: 79 mg/dL (ref 70–99)
Potassium: 4.6 mmol/L (ref 3.5–5.2)
Sodium: 139 mmol/L (ref 134–144)
eGFR: 83 mL/min/1.73 (ref >59)

## 2024-09-12 ENCOUNTER — Other Ambulatory Visit: Payer: Self-pay | Admitting: Student

## 2024-09-12 DIAGNOSIS — H6991 Unspecified Eustachian tube disorder, right ear: Secondary | ICD-10-CM

## 2024-10-29 ENCOUNTER — Encounter: Payer: Self-pay | Admitting: Gastroenterology
